# Patient Record
Sex: Female | Born: 1965
Health system: Southern US, Community
[De-identification: ages and names within clinical notes are randomized; demographics above are authoritative.]

## PROBLEM LIST (undated history)

## (undated) DIAGNOSIS — Z9189 Other specified personal risk factors, not elsewhere classified: Secondary | ICD-10-CM

## (undated) DIAGNOSIS — C4492 Squamous cell carcinoma of skin, unspecified: Secondary | ICD-10-CM

## (undated) DIAGNOSIS — K760 Fatty (change of) liver, not elsewhere classified: Secondary | ICD-10-CM

## (undated) DIAGNOSIS — G43909 Migraine, unspecified, not intractable, without status migrainosus: Secondary | ICD-10-CM

## (undated) DIAGNOSIS — R87619 Unspecified abnormal cytological findings in specimens from cervix uteri: Secondary | ICD-10-CM

## (undated) DIAGNOSIS — D219 Benign neoplasm of connective and other soft tissue, unspecified: Secondary | ICD-10-CM

## (undated) HISTORY — DX: Unspecified abnormal cytological findings in specimens from cervix uteri: R87.619

## (undated) HISTORY — DX: Fatty (change of) liver, not elsewhere classified: K76.0

## (undated) HISTORY — PX: PLANTAR FASCIA SURGERY: SHX746

## (undated) HISTORY — DX: Squamous cell carcinoma of skin, unspecified: C44.92

## (undated) HISTORY — DX: Other specified personal risk factors, not elsewhere classified: Z91.89

## (undated) HISTORY — DX: Migraine, unspecified, not intractable, without status migrainosus: G43.909

## (undated) HISTORY — DX: Benign neoplasm of connective and other soft tissue, unspecified: D21.9

---

## 1990-11-22 HISTORY — PX: AUGMENTATION MAMMAPLASTY: SUR837

## 2014-03-04 DIAGNOSIS — Z9189 Other specified personal risk factors, not elsewhere classified: Secondary | ICD-10-CM

## 2014-03-04 HISTORY — DX: Other specified personal risk factors, not elsewhere classified: Z91.89

## 2016-02-23 ENCOUNTER — Encounter: Payer: Self-pay | Admitting: Obstetrics and Gynecology

## 2016-02-23 ENCOUNTER — Ambulatory Visit (INDEPENDENT_AMBULATORY_CARE_PROVIDER_SITE_OTHER): Payer: 59 | Admitting: Obstetrics and Gynecology

## 2016-02-23 VITALS — BP 108/68 | HR 70 | Resp 16 | Ht 67.25 in | Wt 178.0 lb

## 2016-02-23 DIAGNOSIS — L853 Xerosis cutis: Secondary | ICD-10-CM | POA: Diagnosis not present

## 2016-02-23 DIAGNOSIS — N952 Postmenopausal atrophic vaginitis: Secondary | ICD-10-CM

## 2016-02-23 DIAGNOSIS — Z0000 Encounter for general adult medical examination without abnormal findings: Secondary | ICD-10-CM | POA: Diagnosis not present

## 2016-02-23 DIAGNOSIS — Z124 Encounter for screening for malignant neoplasm of cervix: Secondary | ICD-10-CM

## 2016-02-23 DIAGNOSIS — R635 Abnormal weight gain: Secondary | ICD-10-CM | POA: Diagnosis not present

## 2016-02-23 DIAGNOSIS — Z01419 Encounter for gynecological examination (general) (routine) without abnormal findings: Secondary | ICD-10-CM

## 2016-02-23 DIAGNOSIS — R61 Generalized hyperhidrosis: Secondary | ICD-10-CM

## 2016-02-23 LAB — POCT URINALYSIS DIPSTICK
Bilirubin, UA: NEGATIVE
Glucose, UA: NEGATIVE
Ketones, UA: NEGATIVE
Leukocytes, UA: NEGATIVE
NITRITE UA: NEGATIVE
PROTEIN UA: NEGATIVE
RBC UA: NEGATIVE
UROBILINOGEN UA: NEGATIVE
pH, UA: 5

## 2016-02-23 LAB — TSH: TSH: 3.04 m[IU]/L

## 2016-02-23 MED ORDER — GABAPENTIN 100 MG PO CAPS: ORAL_CAPSULE | ORAL | Status: DC

## 2016-02-23 MED ORDER — ESTRADIOL 0.1 MG/GM VA CREA: TOPICAL_CREAM | VAGINAL | Status: DC

## 2016-02-23 NOTE — Progress Notes (Signed)
50 y.o. [id number] MarriedCaucasianF here for annual exam.  She is PMP, no bleeding. She continues to have vasomotor symptoms. She has changed her diet and she is down to 5 hot flashes a day (down from 20). She isn't sleeping well, worse since menopause. 3 wakes up at least 3 x a night, +/- sweating. She has tried herbal supplements in the past no help. She was on a SSRI in the past for her migraines, helped her vasomotor symptoms.  She is sexually active, some vaginal dryness, uses a lubricant.  She has a 25% risk of breast cancer and is getting mammograms and intermittent MRI's (was q 6 months for a while). Due for a mammogram and MRI.  Mom had breast cancer at 59, still living, no BRCA testing. No other family history of breast cancer.  She can't loose weight, dry skin. Great energy.    Patient's last menstrual period was 12/24/2007.          Sexually active: Yes.    The current method of family planning is vasectomy.    Exercising: Yes.    walk & weights, walking 7 miles a day and weights a few days a week.  Smoker:  no  Health Maintenance: Pap:  2015 History of abnormal Pap:  Yes, ? Cone biopsy in her late 20's, normal since.  MMG: & MRI 2016 neg per patient Colonoscopy:  2015 normal BMD:   2014 TDaP:  ?, she will f/u with primary  Gardasil: no    reports that she has never smoked. She does not have any smokeless tobacco history on file. She reports that she drinks about 2.4 - 3.0 oz of alcohol per week. She reports that she does not use illicit drugs. Retired as a Human resources officer. Kids are 52 and 58 (both in school out of state)  Past Medical History  Diagnosis Date  . Abnormal Pap smear of cervix     unsure of procedure done. over 28yr ago  . Migraines   . Squamous cell skin cancer   . Fibroid     over 260yrago  Migraines have improved since menopause, mild auras  Past Surgical History  Procedure Laterality Date  . Plantar fascia surgery    . Augmentation mammaplasty    Mohs  surgery  Current Outpatient Prescriptions  Medication Sig Dispense Refill  . CHERRY PO Take by mouth daily. Pill form.    . Glucosamine HCl (GLUCOSAMINE PO) Take by mouth daily.    . Multiple Vitamins-Minerals (MULTIVITAMIN PO) Take by mouth daily.    . SUMAtriptan (IMITREX) 50 MG tablet Take 50 mg by mouth every 2 (two) hours as needed for migraine. Reported on 02/23/2016     No current facility-administered medications for this visit.    History reviewed. No pertinent family history.  Review of Systems : negative other than vasomotor symptoms. Some urgency to void, no leakage. Caffeine, 3 cups of coffee a day.   Exam:   BP 108/68 mmHg  Pulse 70  Resp 16  Ht 5' 7.25" (1.708 m)  Wt 178 lb (80.74 kg)  BMI 27.68 kg/m2  LMP 12/24/2007  Weight change: _0 @ Height:   Height: 5' 7.25" (170.8 cm)  Ht Readings from Last 3 Encounters:  02/23/16 5' 7.25" (1.708 m)    General appearance: alert, cooperative and appears stated age Head: Normocephalic, without obvious abnormality, atraumatic Neck: no adenopathy, supple, symmetrical, trachea midline and thyroid normal to inspection and palpation Lungs: clear to auscultation bilaterally Breasts: normal appearance,  no masses or tenderness Heart: regular rate and rhythm Abdomen: soft, non-tender; bowel sounds normal; no masses,  no organomegaly Extremities: extremities normal, atraumatic, no cyanosis or edema Skin: Skin color, texture, turgor normal. No rashes or lesions Lymph nodes: Cervical, supraclavicular, and axillary nodes normal. No abnormal inguinal nodes palpated Neurologic: Grossly normal   Pelvic: External genitalia:  no lesions              Urethra:  normal appearing urethra with no masses, tenderness or lesions              Bartholins and Skenes: normal                 Vagina: normal appearing vagina with normal color and discharge, no lesions              Cervix: no lesions               Bimanual Exam:  Uterus:   normal size, contour, position, consistency, mobility, non-tender              Adnexa: no mass, fullness, tenderness               Rectovaginal: Confirms               Anus:  normal sphincter tone, no lesions  Chaperone was present for exam.  A:  Well Woman with normal exam  Elevated risk of breast cancer  Vasomotor symptoms, worst at night  Vaginal atrophy  Can't loose weight, dry skin    P:   Breast imaging  Pap with hpv  Estrace cream   Gabapentin at bedtime for nightsweats/sleep  Discussed breast self exam  Discussed calcium and vit D intake  TSH, vit D

## 2016-02-24 LAB — VITAMIN D 25 HYDROXY (VIT D DEFICIENCY, FRACTURES): VIT D 25 HYDROXY: 24 ng/mL — AB (ref 30–100)

## 2016-02-25 ENCOUNTER — Encounter: Payer: Self-pay | Admitting: Emergency Medicine

## 2016-02-25 ENCOUNTER — Telehealth: Payer: Self-pay | Admitting: Emergency Medicine

## 2016-02-25 LAB — IPS PAP TEST WITH HPV

## 2016-02-25 NOTE — Telephone Encounter (Signed)
-----   Message from Salvadore Dom, MD sent at 02/25/2016 12:30 PM EDT ----- 02 Please inform of normal TSH, low vit D. She should be on a long term supplement of 1,000 IU of vit D daily.

## 2016-02-25 NOTE — Telephone Encounter (Signed)
Message left to return call to [place] at [phone].   Patient needs results from Dr. Talbert Nan.   02 recall placed.   Patient due for screening mammogram.  History of retropectoral saline implants.  Per MRI dated 03/04/2014 Bilateral Breast MRI completed at [place] in [facility], patient has a history of 20% or greater risk for breast cancer calculated using Tyrer-Cuzick model.

## 2016-02-25 NOTE — Telephone Encounter (Signed)
Patient returned call. She is given message from Dr. Talbert Nan regarding lab results. She verbalizes understanding of results and will start daily Vitamin D.   She is advised to call [facility] to schedule screening mammogram. Then can plan Breast MRI. Patient with history of claustrophobia, has required conscious [place]. Last MRI 03/04/2014. Last screening mammogram 03/06/2015.   Advised will need to plan MRI at hospital if needs conscious sedation. Patient with history of claustrophobia

## 2016-02-26 ENCOUNTER — Other Ambulatory Visit: Payer: Self-pay

## 2016-02-26 DIAGNOSIS — Z1231 Encounter for screening mammogram for malignant neoplasm of breast: Secondary | ICD-10-CM

## 2016-03-06 ENCOUNTER — Ambulatory Visit (INDEPENDENT_AMBULATORY_CARE_PROVIDER_SITE_OTHER): Payer: 59 | Admitting: Physician Assistant

## 2016-03-06 VITALS — BP 112/72 | HR 69 | Temp 97.7°F | Resp 18 | Ht 67.25 in | Wt 175.0 lb

## 2016-03-06 DIAGNOSIS — Z299 Encounter for prophylactic measures, unspecified: Secondary | ICD-10-CM

## 2016-03-06 DIAGNOSIS — H01006 Unspecified blepharitis left eye, unspecified eyelid: Secondary | ICD-10-CM

## 2016-03-06 DIAGNOSIS — Z418 Encounter for other procedures for purposes other than remedying health state: Secondary | ICD-10-CM

## 2016-03-06 MED ORDER — DOXYCYCLINE HYCLATE 100 MG PO CAPS: 100.0000 mg | ORAL_CAPSULE | Freq: Two times a day (BID) | ORAL | Status: AC

## 2016-03-06 MED ORDER — FLUCONAZOLE 150 MG PO TABS: 150.0000 mg | ORAL_TABLET | Freq: Once | ORAL | Status: DC

## 2016-03-06 NOTE — Patient Instructions (Signed)
     IF you received an x-ray today, you will receive an invoice from [facility]. Please contact [facility] at [phone] with questions or concerns regarding your invoice.   IF you received labwork today, you will receive an invoice from [facility]. Please contact [facility] at [phone] with questions or concerns regarding your invoice.   Our billing staff will not be able to assist you with questions regarding bills from these companies.  You will be contacted with the lab results as soon as they are available. The fastest way to get your results is to activate your My Chart account. Instructions are located on the last page of this paperwork. If you have not heard from us regarding the results in 2 weeks, please contact this office.      

## 2016-03-06 NOTE — Progress Notes (Signed)
   03/06/2016 11:01 AM   DOB: [birth year] / MRN: [id number]  SUBJECTIVE:  Tricia Barron is a 50 y.o. female presenting for left eye lid swelling.  She denies frank eye pain, changes in vision, photophobia, nausea, foreign body sensation,  and HA.  Has tried polytrim starting two days ago and has had no improvement. Has also been using warm compresses and this has not helped either.  She has had this problem before and her reports her eye swelled shut and she had to go to the ED and required antibiotics.  She does not complain of dry eyes and dry mouth.   She is allergic to phenergan.   She  has a past medical history of Abnormal Pap smear of cervix; Migraines; Squamous cell skin cancer; Fibroid; and At risk for breast cancer (03/04/2014).    She  reports that she has never smoked. She does not have any smokeless tobacco history on file. She reports that she drinks about 2.4 - 3.0 oz of alcohol per week. She reports that she does not use illicit drugs. She  reports that she currently engages in sexual activity and has had female partners. The patient  has past surgical history that includes Plantar fascia surgery and Augmentation mammaplasty.  Her family history includes Breast cancer (age of onset: 15) in her mother.  ROS  Negative unless otherwise states in HPI.   Problem list and medications reviewed and updated by myself where necessary, and exist elsewhere in the encounter.   OBJECTIVE:  BP 112/72 mmHg  Pulse 69  Temp(Src) 97.7 F (36.5 C) (Oral)  Resp 18  Ht 5' 7.25" (1.708 m)  Wt 175 lb (79.379 kg)  BMI 27.21 kg/m2  SpO2 98%  LMP 12/24/2007  Physical Exam  Constitutional: Vital signs are normal.  Eyes: Conjunctivae and EOM are normal. Pupils are equal, round, and reactive to light.    Cardiovascular: Normal rate and regular rhythm.   Pulmonary/Chest: Effort normal and breath sounds normal.     Visual Acuity Screening   Right eye Left eye Both eyes  Without correction:  20/15-1 20/20 20/15-1  With correction:        No results found for this or any previous visit (from the past 72 hour(s)).  No results found.  ASSESSMENT AND PLAN  Noris was seen today for eye infection.  Diagnoses and all orders for this visit:  Blepharitis, left: She appears to be headed into a direction of cellulitis of the upper lid. She has no frank eye symptoms or worrisome signs at this time.   Will start abx today.  Advised that if she worsens to call me and I will get her in with Dr. Katy Fitch.  Advised she go to the ED is she suddenly becomes worse over the holiday. She reports yeast infections with abx therapy.  Will send diflucan and advised she take only as needed.   -     doxycycline (VIBRAMYCIN) 100 MG capsule; Take 1 capsule (100 mg total) by mouth 2 (two) times daily.    The patient was advised to call or return to clinic if she does not see an improvement in symptoms or to seek the care of the closest emergency department if she worsens with the above plan.   Philis Fendt, MHS, PA-C [facility] 03/06/2016 11:01 AM

## 2016-03-08 ENCOUNTER — Telehealth: Payer: Self-pay

## 2016-03-08 DIAGNOSIS — H01006 Unspecified blepharitis left eye, unspecified eyelid: Secondary | ICD-10-CM

## 2016-03-08 NOTE — Telephone Encounter (Signed)
Referral in. Can we get her in as soon as possible.

## 2016-03-08 NOTE — Telephone Encounter (Signed)
Pt was seen over the weekend for an eye infection and patient states she is still having problems and dr Carlis Abbott stated to call back and he would refer her   Best number [phone]

## 2016-03-09 ENCOUNTER — Encounter: Payer: Self-pay | Admitting: Obstetrics and Gynecology

## 2016-03-16 ENCOUNTER — Ambulatory Visit
Admission: RE | Admit: 2016-03-16 | Discharge: 2016-03-16 | Disposition: A | Discharge status: Home / Self Care | Payer: 59 | Source: Ambulatory Visit

## 2016-03-16 ENCOUNTER — Other Ambulatory Visit: Payer: Self-pay

## 2016-03-16 DIAGNOSIS — Z1231 Encounter for screening mammogram for malignant neoplasm of breast: Secondary | ICD-10-CM

## 2016-03-18 ENCOUNTER — Telehealth: Payer: Self-pay

## 2016-03-18 DIAGNOSIS — Z803 Family history of malignant neoplasm of breast: Secondary | ICD-10-CM

## 2016-03-18 DIAGNOSIS — Z9189 Other specified personal risk factors, not elsewhere classified: Secondary | ICD-10-CM

## 2016-03-18 MED ORDER — LORAZEPAM 1 MG PO TABS: ORAL_TABLET | ORAL | Status: DC

## 2016-03-18 NOTE — Telephone Encounter (Signed)
-----   Message from Salvadore Dom, MD sent at 03/18/2016 11:06 AM EDT ----- Regarding: RE: breast MRI Yes please set her up. ----- Message -----    From: Jasmine Awe, RN    Sent: 03/17/2016   9:18 AM      To: Salvadore Dom, MD Subject: breast MRI                                     Dr.Jertson,  Patient was seen on 03/16/2016 at the [facility] for bilateral screening mammogram. Report is available in EPIC. Olivia Mackie asked I keep an eye out for these results as the patient needs a breast MRI. Is it okay to proceed with ordering and scheduling the MRI at this time? Per Olivia Mackie the patient will need to have an open MRI at [place] with Ativan as discussed with you previously. Please advise.  Thank you, Verline Lema

## 2016-03-18 NOTE — Telephone Encounter (Signed)
Spoke with patient. Advised rx for Ativan has been written and is ready for pick up at patient's earliest convenience. Patient states that she has a follow up appointment with Dr.Jertson on 03/23/2016 and would like to pick up prescription at that time.

## 2016-03-18 NOTE — Telephone Encounter (Signed)
Script for [employer]

## 2016-03-18 NOTE — Telephone Encounter (Signed)
Spoke with patient. Patient is agreeable to proceed with breast MRI at [place] in their open MRI machine. She would like rx for Ativan to help reduce anxiety during her appointment. She is aware she will need someone to drive her to and from her MRI appointment. Order for MRI breast bilateral w wo contrast placed. She is aware this will be precerted by our insurance and billing department. She will be contacted directly by [facility] to schedule. She is agreeable and verbalizes understanding.  Cc: Lerry Liner for [employer] to Dr.Jertson for review and rx order for Ativan.

## 2016-03-18 NOTE — Telephone Encounter (Signed)
Left message to call Mansur Patti at [phone]. 

## 2016-03-22 ENCOUNTER — Ambulatory Visit: Payer: 59 | Admitting: Obstetrics and Gynecology

## 2016-03-22 NOTE — Telephone Encounter (Signed)
Patient is scheduled for breast MRI on 03/25/2016 with [facility]. Will keep in imaging hold.  Routing to provider for final review. Patient agreeable to disposition. Will close encounter.

## 2016-03-23 ENCOUNTER — Encounter: Payer: Self-pay | Admitting: Obstetrics and Gynecology

## 2016-03-23 ENCOUNTER — Ambulatory Visit (INDEPENDENT_AMBULATORY_CARE_PROVIDER_SITE_OTHER): Payer: 59 | Admitting: Obstetrics and Gynecology

## 2016-03-23 VITALS — BP 112/62 | HR 68 | Resp 16 | Ht 67.0 in | Wt 180.0 lb

## 2016-03-23 DIAGNOSIS — Z780 Asymptomatic menopausal state: Secondary | ICD-10-CM

## 2016-03-23 DIAGNOSIS — R61 Generalized hyperhidrosis: Secondary | ICD-10-CM

## 2016-03-23 DIAGNOSIS — N951 Menopausal and female climacteric states: Secondary | ICD-10-CM | POA: Diagnosis not present

## 2016-03-23 DIAGNOSIS — R232 Flushing: Secondary | ICD-10-CM

## 2016-03-23 NOTE — Progress Notes (Signed)
GYNECOLOGY  VISIT   HPI: 50 y.o.   Married  Caucasian  female   G2P2002 with Patient's last menstrual period was 12/24/2007.   here for   F/u after starting Gabapentin. The patient was started on gabapentin last month for night sweats. The gabapentin was given her bad dreams, she was on it for 1.5 weeks. She was on lexapro in the past, helped her vasomotor symptoms, but didn't like how she felt on it. She is up 3-4 x a night with sweats, overall tolerable. She is very sensitive to medicine.  She has her breast MRI scheduled for later this week, has ativan to take for it (script given)  GYNECOLOGIC HISTORY: Patient's last menstrual period was 12/24/2007. Contraception:Post Menopausal Menopausal hormone therapy: none, vaginal estrogen only        OB History    Gravida Para Term Preterm AB TAB SAB Ectopic Multiple Living   2 2 2       2          There are no active problems to display for this patient.   Past Medical History  Diagnosis Date  . Abnormal Pap smear of cervix     unsure of procedure done. over 68yrs ago  . Migraines   . Squamous cell skin cancer   . Fibroid     over 71yrs ago  . At risk for breast cancer 03/04/2014    High-20% or greater/Tyrer-Cuzick Model     Past Surgical History  Procedure Laterality Date  . Plantar fascia surgery    . Augmentation mammaplasty      Current Outpatient Prescriptions  Medication Sig Dispense Refill  . estradiol (ESTRACE) 0.1 MG/GM vaginal cream 1 gram vaginally qhs x 1 week, then twice weekly at bedtime 42.5 g 1  . Glucosamine HCl (GLUCOSAMINE PO) Take by mouth daily.    Marland Kitchen LORazepam (ATIVAN) 1 MG tablet 1 tab po 1 hour prior to the MRI 1 tablet 0  . Multiple Vitamins-Minerals (MULTIVITAMIN PO) Take by mouth daily.    . SUMAtriptan (IMITREX) 50 MG tablet Take 50 mg by mouth every 2 (two) hours as needed for migraine. Reported on 02/23/2016    . VITAMIN D, CHOLECALCIFEROL, PO Take 600 mg by mouth daily.    Marland Kitchen gabapentin (NEURONTIN)  100 MG capsule 1 cap qhs x 1 week, if doing well can then increase to 2 caps qhs, in one more week can increase to 3 caps po qhs (Patient not taking: Reported on 03/06/2016) 90 capsule 1   No current facility-administered medications for this visit.     ALLERGIES: Phenergan  Family History  Problem Relation Age of Onset  . Breast cancer Mother 82    stage 2    Social History   Social History  . Marital Status: Married    Spouse Name: N/A  . Number of Children: N/A  . Years of Education: N/A   Occupational History  . Not on file.   Social History Main Topics  . Smoking status: Never Smoker   . Smokeless tobacco: Not on file  . Alcohol Use: 2.4 - 3.0 oz/week    4-5 Standard drinks or equivalent per week  . Drug Use: No  . Sexual Activity:    Partners: Male     Comment: husband vasectomy   Other Topics Concern  . Not on file   Social History Narrative    Review of Systems  Constitutional: Negative.   HENT: Negative.   Eyes:  Stye on left eye  Respiratory: Negative.   Cardiovascular: Negative.   Genitourinary: Negative.   Musculoskeletal: Negative.   Skin:       New or changed mole/lump  Neurological: Negative.   Endo/Heme/Allergies: Negative.   Psychiatric/Behavioral: Negative.     PHYSICAL EXAMINATION:    BP 112/62 mmHg  Pulse 68  Resp 16  Ht 5\' 7"  (1.702 m)  Wt 180 lb (81.647 kg)  BMI 28.19 kg/m2  LMP 12/24/2007    General appearance: alert, cooperative and appears stated age  ASSESSMENT Menopausal vasomotor symptoms, hasn't tolerated lexapro or gabapentin, I wouldn't recommend systemic estrogen given her elevated risk of breast cancer    PLAN Discussed herbal products, behavioral changes Offered a different SSRI, she declines She states she can tolerate her symptoms   An After Visit Summary was printed and given to the patient.  10-15 minutes face to face time of which over 50% was spent in counseling.

## 2016-03-25 ENCOUNTER — Ambulatory Visit
Admission: RE | Admit: 2016-03-25 | Discharge: 2016-03-25 | Disposition: A | Discharge status: Home / Self Care | Payer: 59 | Source: Ambulatory Visit | Attending: Obstetrics and Gynecology | Admitting: Obstetrics and Gynecology

## 2016-03-25 ENCOUNTER — Other Ambulatory Visit: Payer: Self-pay | Admitting: Obstetrics and Gynecology

## 2016-03-25 DIAGNOSIS — Z803 Family history of malignant neoplasm of breast: Secondary | ICD-10-CM

## 2016-03-25 DIAGNOSIS — Z9189 Other specified personal risk factors, not elsewhere classified: Secondary | ICD-10-CM

## 2016-03-25 MED ORDER — GADOBENATE DIMEGLUMINE 529 MG/ML IV SOLN
16.0000 mL | Freq: Once | INTRAVENOUS | Status: AC | PRN
  Administered 2016-03-25: 16 mL via INTRAVENOUS

## 2016-04-26 ENCOUNTER — Encounter: Payer: Self-pay | Admitting: Obstetrics and Gynecology

## 2016-04-27 ENCOUNTER — Telehealth: Payer: Self-pay | Admitting: Obstetrics and Gynecology

## 2016-04-27 NOTE — Telephone Encounter (Signed)
Return call to patient and she is advised of results per Dr. Talbert Nan and they were sent via mychart. Patient will check her mychart account.   Patient states that MRI was very uncomfortable and that she does not want to do open MRI again with oral anxiolytics. She states that the medication did not work for her very long.  She will research her options for next MRI, but feels she may need sedation again.   Routing to Dr. Talbert Nan and will close.

## 2016-04-27 NOTE — Telephone Encounter (Signed)
Mychart message from Dr. Talbert Nan:    Entered by Salvadore Dom, MD at 03/31/2016 10:17 AM   Hi Tricia Barron,  Your breast MRI was normal! You need a follow up mammogram next year and a follow up MRI in 1-2 years. Please call the office with any questions.  [name]

## 2016-04-27 NOTE — Telephone Encounter (Signed)
Patient is calling for her MRI results. Patient said she had this MRI over a month ago and no one has called her with any results.

## 2016-11-24 DIAGNOSIS — L570 Actinic keratosis: Secondary | ICD-10-CM | POA: Diagnosis not present

## 2017-01-20 ENCOUNTER — Encounter: Payer: Self-pay | Admitting: Podiatry

## 2017-01-20 ENCOUNTER — Ambulatory Visit (INDEPENDENT_AMBULATORY_CARE_PROVIDER_SITE_OTHER): Payer: 59

## 2017-01-20 ENCOUNTER — Ambulatory Visit (INDEPENDENT_AMBULATORY_CARE_PROVIDER_SITE_OTHER): Payer: 59 | Admitting: Podiatry

## 2017-01-20 DIAGNOSIS — M722 Plantar fascial fibromatosis: Secondary | ICD-10-CM | POA: Diagnosis not present

## 2017-01-20 NOTE — Progress Notes (Signed)
   Subjective:    Patient ID: Tricia Barron, female    DOB: 06-29-1966, 51 y.o.   MRN: [id number]  HPI: She presents today with chief complaint of right heel pain. States that she is bad surgery for plantar fasciitis on her left foot several years ago but recently has developed plan a fasciitis in her right foot as of December 2017. Warnings are particularly bad and she states that she has radiating pain of the back of her leg now. No trauma.    Review of Systems  All other systems reviewed and are negative.      Objective:   Physical Exam: Vital signs are stable alert and oriented 3. Pulses are palpable. Neurologic sensorium is intact. Deep tendon reflexes are intact muscle strength is normal. Orthopedic evaluation of his wrist pain on palpation medial calcaneal tubercle of the right heel. Radiographs taken today demonstrate plantar distally oriented calcaneal heel spur with soft tissue increase in density at the plantar fascia calcaneal insertion site. No fractures are noted. Cutaneous evaluation Mr. supple well-hydrated cutis no erythema edema cellulitis drainage or odor.        Assessment & Plan:  Assessment: Plantar fasciitis.  Plan: Injected the right heel today with Kenalog and local anesthetic at her plantar fascia brace and a night splint. Discussed appropriate shoe gear stretching exercises ice therapy shoe modification started her on a Medrol Dosepak to be followed by meloxicam. Follow up with her in 1 month

## 2017-01-20 NOTE — Patient Instructions (Signed)

## 2017-01-21 ENCOUNTER — Telehealth: Payer: Self-pay

## 2017-01-21 NOTE — Telephone Encounter (Signed)
Pt called stating that her Rx was not sent to the pharmacy

## 2017-01-22 ENCOUNTER — Other Ambulatory Visit: Payer: Self-pay | Admitting: Podiatry

## 2017-01-22 MED ORDER — METHYLPREDNISOLONE 4 MG PO TBPK: ORAL_TABLET | ORAL | 0 refills | Status: DC

## 2017-01-22 MED ORDER — MELOXICAM 15 MG PO TABS: 15.0000 mg | ORAL_TABLET | Freq: Every day | ORAL | 3 refills | Status: DC

## 2017-01-22 NOTE — Telephone Encounter (Signed)
I sent it on Saturday.  You can check to see if she went to pick it up

## 2017-02-17 ENCOUNTER — Ambulatory Visit (INDEPENDENT_AMBULATORY_CARE_PROVIDER_SITE_OTHER): Payer: 59 | Admitting: Podiatry

## 2017-02-17 ENCOUNTER — Encounter: Payer: Self-pay | Admitting: Podiatry

## 2017-02-17 DIAGNOSIS — M722 Plantar fascial fibromatosis: Secondary | ICD-10-CM | POA: Diagnosis not present

## 2017-02-17 NOTE — Progress Notes (Signed)
She presents today for follow-up of her right heel states is doing for the past 2 weeks and then started to hurt again.  Objective: Vital signs are stable alert and oriented 3. Pulses are palpable. She's been on palpation medial calcaneal tubercle of the right heel.  Assessment: Plantar fasciitis right.  Plan: She was scanned for set of orthotics today and reinjected with her heel. Follow-up with her once her orthotics come in.

## 2017-02-18 ENCOUNTER — Other Ambulatory Visit: Payer: Self-pay | Admitting: Obstetrics and Gynecology

## 2017-02-18 DIAGNOSIS — Z1231 Encounter for screening mammogram for malignant neoplasm of breast: Secondary | ICD-10-CM

## 2017-02-28 ENCOUNTER — Encounter: Payer: Self-pay | Admitting: Obstetrics and Gynecology

## 2017-02-28 ENCOUNTER — Ambulatory Visit (INDEPENDENT_AMBULATORY_CARE_PROVIDER_SITE_OTHER): Payer: 59 | Admitting: Obstetrics and Gynecology

## 2017-02-28 VITALS — BP 100/60 | HR 60 | Resp 14 | Ht 67.0 in | Wt 190.0 lb

## 2017-02-28 DIAGNOSIS — E559 Vitamin D deficiency, unspecified: Secondary | ICD-10-CM | POA: Diagnosis not present

## 2017-02-28 DIAGNOSIS — Z23 Encounter for immunization: Secondary | ICD-10-CM | POA: Diagnosis not present

## 2017-02-28 DIAGNOSIS — R635 Abnormal weight gain: Secondary | ICD-10-CM

## 2017-02-28 DIAGNOSIS — Z0000 Encounter for general adult medical examination without abnormal findings: Secondary | ICD-10-CM | POA: Diagnosis not present

## 2017-02-28 DIAGNOSIS — Z1159 Encounter for screening for other viral diseases: Secondary | ICD-10-CM

## 2017-02-28 DIAGNOSIS — Z01419 Encounter for gynecological examination (general) (routine) without abnormal findings: Secondary | ICD-10-CM

## 2017-02-28 DIAGNOSIS — L853 Xerosis cutis: Secondary | ICD-10-CM | POA: Diagnosis not present

## 2017-02-28 LAB — COMPREHENSIVE METABOLIC PANEL
ALK PHOS: 56 U/L (ref 33–130)
ALT: 46 U/L — AB (ref 6–29)
AST: 34 U/L (ref 10–35)
Albumin: 4.5 g/dL (ref 3.6–5.1)
BILIRUBIN TOTAL: 0.5 mg/dL (ref 0.2–1.2)
BUN: 16 mg/dL (ref 7–25)
CO2: 31 mmol/L (ref 20–31)
Calcium: 10 mg/dL (ref 8.6–10.4)
Chloride: 103 mmol/L (ref 98–110)
Creat: 0.8 mg/dL (ref 0.50–1.05)
Glucose, Bld: 90 mg/dL (ref 65–99)
Potassium: 4.4 mmol/L (ref 3.5–5.3)
Sodium: 139 mmol/L (ref 135–146)
Total Protein: 7.2 g/dL (ref 6.1–8.1)

## 2017-02-28 LAB — CBC
HCT: 45 % (ref 35.0–45.0)
Hemoglobin: 15.2 g/dL (ref 11.7–15.5)
MCH: 31.7 pg (ref 27.0–33.0)
MCHC: 33.8 g/dL (ref 32.0–36.0)
MCV: 93.8 fL (ref 80.0–100.0)
MPV: 10.4 fL (ref 7.5–12.5)
PLATELETS: 272 10*3/uL (ref 140–400)
RBC: 4.8 MIL/uL (ref 3.80–5.10)
RDW: 13.5 % (ref 11.0–15.0)
WBC: 6.1 10*3/uL (ref 3.8–10.8)

## 2017-02-28 LAB — LIPID PANEL
CHOL/HDL RATIO: 2 ratio (ref <5.0)
Cholesterol: 126 mg/dL (ref <200)
HDL: 62 mg/dL (ref >50)
LDL Cholesterol: 50 mg/dL (ref <100)
Triglycerides: 68 mg/dL (ref <150)
VLDL: 14 mg/dL (ref <30)

## 2017-02-28 LAB — HEPATITIS C ANTIBODY: HCV AB: NEGATIVE

## 2017-02-28 LAB — TSH: TSH: 1.72 m[IU]/L

## 2017-02-28 MED ORDER — SUMATRIPTAN SUCCINATE 50 MG PO TABS: ORAL_TABLET | ORAL | 2 refills | Status: DC

## 2017-02-28 NOTE — Patient Instructions (Signed)

## 2017-02-28 NOTE — Progress Notes (Addendum)
51 y.o. [id number] MarriedCaucasianF here for annual exam.  No vaginal bleeding. Sexually active, no pain, some dryness.  She has an elevated risk of breast cancer. She had a negative breast MRI on 03/25/16, mammogram scheduled later this month. She didn't do well with the MRI, in the past she had sedation with it. She gets very anxious, finds it uncomfortable. She had a much better experience in [place].  She has vasomotor symptoms, mostly at night. Tolerable. Didn't like the gabapentin.  She has had some weight gain and dry skin.  She is having trouble sleeping, falls asleep, wakes up. Worsened with menopause. She went to a sleep clinic in the distant past. She takes Valarean and melatonin. Occasionally snores, wakes her up. She has a bed that adjusts.  Rare migraines, needs some imitrex.     Patient's last menstrual period was 12/24/2007.          Sexually active: Yes.    The current method of family planning is vasectomy.    Exercising: Yes.    weights/ eliptical  Smoker:  no  Health Maintenance: Pap:  02-23-16 WNL NEG HR HPV/ 12-09-11 WNL NEG HR HPV History of abnormal Pap:  Yes years ago, ? [facility] MMG:  03-16-16 WNL  Colonoscopy:  2016 WNL per patient  BMD:   Never TDaP:  unsure Gardasil: N/A   reports that she has never smoked. She has never used smokeless tobacco. She reports that she drinks about 1.8 oz of alcohol per week . She reports that she does not use drugs. Retired as a Human resources officer. Kids are 22 and 19. Oldest is working, youngest wants to go to med school.   Past Medical History:  Diagnosis Date  . Abnormal Pap smear of cervix    unsure of procedure done. over 16yrs ago  . At risk for breast cancer 03/04/2014   High-20% or greater/Tyrer-Cuzick Model   . Fibroid    over 83yrs ago  . Migraines   . Squamous cell skin cancer     Past Surgical History:  Procedure Laterality Date  . AUGMENTATION MAMMAPLASTY    . PLANTAR FASCIA SURGERY      Current Outpatient  Prescriptions  Medication Sig Dispense Refill  . Glucosamine HCl (GLUCOSAMINE PO) Take by mouth daily.    . meloxicam (MOBIC) 15 MG tablet Take 1 tablet (15 mg total) by mouth daily. 30 tablet 3  . Multiple Vitamins-Minerals (MULTIVITAMIN PO) Take by mouth daily.    . SUMAtriptan (IMITREX) 50 MG tablet Take 50 mg by mouth every 2 (two) hours as needed for migraine. Reported on 02/23/2016     No current facility-administered medications for this visit.     Family History  Problem Relation Age of Onset  . Breast cancer Mother 70    stage 2    Review of Systems  Constitutional: Negative.   HENT: Negative.   Eyes: Negative.   Respiratory: Negative.   Cardiovascular: Negative.   Gastrointestinal: Negative.   Endocrine: Negative.   Genitourinary: Negative.   Musculoskeletal: Negative.   Skin: Negative.   Allergic/Immunologic: Negative.   Neurological: Negative.   Psychiatric/Behavioral: Negative.     Exam:   BP 100/60 (BP Location: Right Arm, Patient Position: Sitting, Cuff Size: Normal)   Pulse 60   Resp 14   Ht 5\' 7"  (1.702 m)   Wt 190 lb (86.2 kg)   LMP 12/24/2007   BMI 29.76 kg/m   Weight change: @WEIGHTCHANGE @ Height:   Height: 5\' 7"  (170.2  cm)  Ht Readings from Last 3 Encounters:  02/28/17 5\' 7"  (1.702 m)  03/23/16 5\' 7"  (1.702 m)  03/06/16 5' 7.25" (1.708 m)    General appearance: alert, cooperative and appears stated age Head: Normocephalic, without obvious abnormality, atraumatic Neck: no adenopathy, supple, symmetrical, trachea midline and thyroid normal to inspection and palpation Lungs: clear to auscultation bilaterally Cardiovascular: regular rate and rhythm Breasts: normal appearance, no masses or tenderness, bilateral implants Abdomen: soft, non-tender; bowel sounds normal; no masses,  no organomegaly Extremities: extremities normal, atraumatic, no cyanosis or edema Skin: Skin color, texture, turgor normal. No rashes or lesions Lymph nodes: Cervical,  supraclavicular, and axillary nodes normal. No abnormal inguinal nodes palpated Neurologic: Grossly normal   Pelvic: External genitalia:  no lesions              Urethra:  normal appearing urethra with no masses, tenderness or lesions              Bartholins and Skenes: normal                 Vagina: normal appearing vagina with normal color and discharge, no lesions              Cervix: no lesions               Bimanual Exam:  Uterus:  normal size, contour, position, consistency, mobility, non-tender              Adnexa: no mass, fullness, tenderness               Rectovaginal: Confirms               Anus:  normal sphincter tone, no lesions  Chaperone was present for exam.  A:  Well Woman with normal exam  Vasomotor symptoms, tolerable  Sleep issues, will f/u with her primary  Migraines, imitrex for prn use  Increased risk breast cancer, mammogram later this month  P:   Screening labs    Vit D   TSH  Hepatitis C (screening)  No pap this year  Mammogram  Will look into options for sedation for MRI  Discussed breast self exam  Discussed calcium and vit D intake  TDAP  Aware of recommendation for yearly MRI, she had a bad experience last year. Will try and find a location that she can have sedation with the breast MRI.   04/10/17 Addendum: [place]04[place]17, normal

## 2017-03-01 ENCOUNTER — Telehealth: Payer: Self-pay | Admitting: *Deleted

## 2017-03-01 LAB — VITAMIN D 25 HYDROXY (VIT D DEFICIENCY, FRACTURES): VIT D 25 HYDROXY: 25 ng/mL — AB (ref 30–100)

## 2017-03-01 NOTE — Telephone Encounter (Signed)
-----   Message from Salvadore Dom, MD sent at 03/01/2017  1:13 PM EDT ----- Please let the patient know that her vit d level is still low. She should start taking 1,000 IU of vit d 3 daily.  Her ALT (liver function) is mildly elevated. She should avoid ETOH, eat a healthy diet, exercise regularly and have her liver panel rechecked in 2 months. Please place the order for a liver panel. The rest of her blood work was normal.

## 2017-03-01 NOTE — Telephone Encounter (Signed)
Left message for patient to call regarding labs -eh 

## 2017-03-01 NOTE — Telephone Encounter (Signed)
Patient returned your call.

## 2017-03-01 NOTE — Telephone Encounter (Signed)
Spoke with patient and gave results. Scheduled for 2 month recheck of liver function. Pt voiced understanding -eh

## 2017-03-02 ENCOUNTER — Telehealth: Payer: Self-pay

## 2017-03-02 DIAGNOSIS — Z803 Family history of malignant neoplasm of breast: Secondary | ICD-10-CM

## 2017-03-02 DIAGNOSIS — Z9189 Other specified personal risk factors, not elsewhere classified: Secondary | ICD-10-CM

## 2017-03-02 NOTE — Telephone Encounter (Signed)
This will have to be done at the hospital as [facility] does not perform MRI's with sedation. When placing order two options are oral sedation and general anesthesia. Please advise which is recommended for this patient.

## 2017-03-02 NOTE — Telephone Encounter (Signed)
She would like IV sedation, like with colonoscopies. She doesn't need to be intubated.

## 2017-03-02 NOTE — Telephone Encounter (Signed)
-----   Message from Salvadore Dom, MD sent at 02/28/2017  2:08 PM EDT ----- Sandrea Hughs, This is a patient that we tried to find a place to do her breast mri with sedation last year. We ended up giving her ativan and she had a bad experience.  Can you please see if you can figure out how to do the breast MRI with sedation? Even if it's in the hospital. Thanks, Sharee Pimple

## 2017-03-02 NOTE — Telephone Encounter (Signed)
Spoke with Tricia Barron at [facility]. Appointment for bilateral breast MRI w wo contrast with IV sedation scheduled for 04/05/2017 at 8 am at [facility], [place], [place] [address]. Patient will need to bring a driver to this appointment. [name] will call the patient to go over any instructions about MRI and about arrival on the day of imaging.  Left message to call [place] at [phone].

## 2017-03-02 NOTE — Telephone Encounter (Signed)
Spoke with Elmo Putt at [place]. Elmo Putt will need to speak with the radiologist regarding imaging and return call. Patient has a mammogram scheduled for 03/18/2017 and this will need to be scheduled after.

## 2017-03-03 NOTE — Telephone Encounter (Signed)
Spoke with patient. Advised of appointment date and time as seen below for bilateral breast MRI w wo contrast with IV sedation. Patient is agreeable and verbalizes understanding. States that she may need to reschedule this appointment and will speak with [name] about scheduling when they contact her regarding appointment and benefits. Placed in imaging hold. Patient states she was notified by our office that her ALT was elevated. Asking if taking meloxicam 15 mg daily could be causing this. Has been taking this medication for a month and a half. Advised NSAID medications can alter live functions studies. Patient is asking if she should consider discontinuing this medication. Advised will review with Dr.Jertson and return call. Patient is agreeable.

## 2017-03-07 NOTE — Telephone Encounter (Signed)
Patient is returning a call to Kaitlyn. °

## 2017-03-07 NOTE — Telephone Encounter (Signed)
The mobic is associated with a less than 2% risk of elevated LFT's. I don't know if stopping the mobic would help. No adjustment in dosage of mobic is recommended with low level elevations of LFT's. I think she is on the mobic for plantar fascitis. If she feels she can get by without the mobic, then I would stop it. She can also discuss other options for treatment of her fascitis with the provider who she sees for that.

## 2017-03-07 NOTE — Telephone Encounter (Signed)
Spoke with patient. Advised of message as seen below from [place]. Patient is agreeable and verbalizes understanding. Patient would like to continue taking Meloxicam at this time as she is on vacation and does not want to risk a flare. May stop taking Meloxicam when she returns. Will think about this and return call with any questions.  Routing to provider for final review. Patient agreeable to disposition. Will close encounter.

## 2017-03-07 NOTE — Telephone Encounter (Signed)
Left message to call Tricia Barron at [phone]. 

## 2017-03-08 ENCOUNTER — Other Ambulatory Visit: Payer: 59

## 2017-03-16 ENCOUNTER — Other Ambulatory Visit: Payer: 59

## 2017-03-17 ENCOUNTER — Telehealth: Payer: Self-pay | Admitting: Obstetrics and Gynecology

## 2017-03-17 NOTE — Telephone Encounter (Signed)
Spoke with patient. Advised order has been faxed with confirmation to [facility] and that appointment with [facility] has been cancelled. Patient is agreeable and verbalizes understanding. Remains in imaging hold.  Routing to provider for final review. Patient agreeable to disposition. Will close encounter.

## 2017-03-17 NOTE — Telephone Encounter (Signed)
Spoke with patient to verify information provided. Patient would like to cancel appointment for MRI with [facility] as she is scheduled in [place] for imaging. Advised will cancel her imaging with [facility] and write a order for this to be performed in [place].   Call to [facility] appointment for bilateral breast MRI w wo contast with IV sedation cancelled. Order for bilateral breast MRI w wo contrast with IV sedation to Dr.Jertson for review and signature to send to [employer] in [place].

## 2017-03-17 NOTE — Telephone Encounter (Signed)
Patient called requesting to cancel her MRI with [facility]. They would not let her cancel this. Patient stated Margaretha Sheffield and Dr. Talbert Nan are aware she may request this. The patient has rescheduled to a [place] facility where she is already scheduled.  [email or web address] MRI with IV sedation Fax: [phone] Phone: [phone]  Appt 5/14//18 at 2:00 PM in [place], [place] order for "sedation and MRI" sent to the facility.

## 2017-03-18 ENCOUNTER — Ambulatory Visit
Admission: RE | Admit: 2017-03-18 | Discharge: 2017-03-18 | Disposition: A | Discharge status: Home / Self Care | Payer: 59 | Source: Ambulatory Visit | Attending: Obstetrics and Gynecology | Admitting: Obstetrics and Gynecology

## 2017-03-18 DIAGNOSIS — Z1231 Encounter for screening mammogram for malignant neoplasm of breast: Secondary | ICD-10-CM

## 2017-03-30 ENCOUNTER — Telehealth: Payer: Self-pay | Admitting: Obstetrics and Gynecology

## 2017-03-30 NOTE — Telephone Encounter (Signed)
Patient called and said she is scheduled outside of [place] for her MRI. She needs [place] to call and cancel it for her on 04/05/17 at [facility]. The patient said she called and requested this before but it was not cancelled  [facility] just called to confirm the appointment and told her it is still scheduled.

## 2017-03-30 NOTE — Telephone Encounter (Signed)
Call to [facility] Radiology department. Per radiology the appointment has been cancelled and the patient is no longer on the schedule. Call to the patient. Advised I have verified that the appointment has been cancelled. Patient verbalizes understanding.  Routing to provider for final review. Patient agreeable to disposition. Will close encounter.

## 2017-04-04 ENCOUNTER — Telehealth: Payer: Self-pay | Admitting: Obstetrics and Gynecology

## 2017-04-04 ENCOUNTER — Encounter (HOSPITAL_COMMUNITY): Payer: Self-pay | Admitting: Anesthesiology

## 2017-04-04 DIAGNOSIS — Z803 Family history of malignant neoplasm of breast: Secondary | ICD-10-CM | POA: Diagnosis not present

## 2017-04-04 DIAGNOSIS — Z1239 Encounter for other screening for malignant neoplasm of breast: Secondary | ICD-10-CM | POA: Diagnosis not present

## 2017-04-04 NOTE — Telephone Encounter (Signed)
Patient returning your call.

## 2017-04-04 NOTE — Telephone Encounter (Signed)
Spoke with Jackelyn Poling who states she will contact [facility] to discuss the patient's bilateral breast MRI. Received telephone call from Maudie Mercury with [name] who states that she still sees the MRI on the schedule. Verified again that this will need to be cancelled as the patient is having this done out of town today. Maudie Mercury has cancelled the patient's MRI.  Left message for patient to call [place] at [phone].

## 2017-04-04 NOTE — Telephone Encounter (Signed)
Spoke with patient. Advised patient I have spoken with Maudie Mercury at [facility] and her appointment for breast MRI has been cancelled and this has been verified. Patient is agreeable.  Routing to provider for final review. Patient agreeable to disposition. Will close encounter.

## 2017-04-04 NOTE — Telephone Encounter (Signed)
Call to [facility] with preadmission testing at [phone]. Left a message to return call regarding patient's breast MRI. This was cancelled on 03/17/2017 and verified on 03/30/2017. Need to again verify this has been cancelled for the patient.

## 2017-04-04 NOTE — Telephone Encounter (Signed)
Patient says she tried cancelling her mri appointment for tomorrow morning over at the hospital but was told our office needed to cancel appointment. She received a call from [place] at [phone].

## 2017-04-05 ENCOUNTER — Ambulatory Visit (HOSPITAL_COMMUNITY): Admission: RE | Admit: 2017-04-05 | Payer: 59 | Source: Ambulatory Visit

## 2017-04-05 ENCOUNTER — Ambulatory Visit (HOSPITAL_COMMUNITY): Payer: 59

## 2017-04-05 ENCOUNTER — Encounter (HOSPITAL_COMMUNITY): Payer: Self-pay

## 2017-04-05 ENCOUNTER — Encounter (HOSPITAL_COMMUNITY): Admission: RE | Payer: Self-pay | Source: Ambulatory Visit

## 2017-04-05 SURGERY — RADIOLOGY WITH ANESTHESIA
Anesthesia: General | Laterality: Bilateral

## 2017-04-13 ENCOUNTER — Telehealth: Payer: Self-pay | Admitting: *Deleted

## 2017-04-13 DIAGNOSIS — Z808 Family history of malignant neoplasm of other organs or systems: Secondary | ICD-10-CM | POA: Diagnosis not present

## 2017-04-13 DIAGNOSIS — L821 Other seborrheic keratosis: Secondary | ICD-10-CM | POA: Diagnosis not present

## 2017-04-13 DIAGNOSIS — Z85828 Personal history of other malignant neoplasm of skin: Secondary | ICD-10-CM | POA: Diagnosis not present

## 2017-04-13 DIAGNOSIS — L570 Actinic keratosis: Secondary | ICD-10-CM | POA: Diagnosis not present

## 2017-04-13 NOTE — Telephone Encounter (Signed)
Pt states she would like another small/medium plantar fascial brace. I informed pt that she could pick up at the front reception and we would bill insurance initially, and bill pt for remainder. I offered pt an appt, pt states she and Dr. Milinda Pointer have a plan.

## 2017-04-21 ENCOUNTER — Encounter: Payer: Self-pay | Admitting: Obstetrics and Gynecology

## 2017-04-21 ENCOUNTER — Other Ambulatory Visit: Payer: Self-pay

## 2017-04-21 DIAGNOSIS — Z9189 Other specified personal risk factors, not elsewhere classified: Secondary | ICD-10-CM

## 2017-04-21 DIAGNOSIS — Z803 Family history of malignant neoplasm of breast: Secondary | ICD-10-CM

## 2017-05-02 ENCOUNTER — Other Ambulatory Visit (INDEPENDENT_AMBULATORY_CARE_PROVIDER_SITE_OTHER): Payer: 59

## 2017-05-02 DIAGNOSIS — R748 Abnormal levels of other serum enzymes: Secondary | ICD-10-CM | POA: Diagnosis not present

## 2017-05-02 DIAGNOSIS — Z0000 Encounter for general adult medical examination without abnormal findings: Secondary | ICD-10-CM

## 2017-05-03 LAB — HEPATIC FUNCTION PANEL
ALBUMIN: 4.3 g/dL (ref 3.5–5.5)
ALK PHOS: 51 IU/L (ref 39–117)
ALT: 55 IU/L — ABNORMAL HIGH (ref 0–32)
AST: 42 IU/L — ABNORMAL HIGH (ref 0–40)
BILIRUBIN TOTAL: 0.5 mg/dL (ref 0.0–1.2)
BILIRUBIN, DIRECT: 0.15 mg/dL (ref 0.00–0.40)
TOTAL PROTEIN: 6.7 g/dL (ref 6.0–8.5)

## 2017-05-10 ENCOUNTER — Encounter: Payer: Self-pay | Admitting: *Deleted

## 2017-05-12 ENCOUNTER — Encounter: Payer: Self-pay | Admitting: Podiatry

## 2017-05-12 ENCOUNTER — Ambulatory Visit (INDEPENDENT_AMBULATORY_CARE_PROVIDER_SITE_OTHER): Payer: 59 | Admitting: Podiatry

## 2017-05-12 ENCOUNTER — Ambulatory Visit: Payer: 59 | Admitting: Family Medicine

## 2017-05-12 VITALS — BP 123/75 | HR 80 | Resp 16

## 2017-05-12 DIAGNOSIS — M722 Plantar fascial fibromatosis: Secondary | ICD-10-CM

## 2017-05-12 MED ORDER — DICLOFENAC SODIUM 1 % TD GEL: 4.0000 g | Freq: Four times a day (QID) | TRANSDERMAL | 3 refills | Status: DC

## 2017-05-12 NOTE — Patient Instructions (Signed)
Pre-Operative Instructions  Congratulations, you have decided to take an important step to improving your quality of life.  You can be assured that the doctors of [facility] will be with you every step of the way.  1. Plan to be at the surgery center/hospital at least 1 (one) hour prior to your scheduled time unless otherwise directed by the surgical center/hospital staff.  You must have a responsible adult accompany you, remain during the surgery and drive you home.  Make sure you have directions to the surgical center/hospital and know how to get there on time. 2. For hospital based surgery you will need to obtain a history and physical form from your family physician within 1 month prior to the date of surgery- we will give you a form for you primary physician.  3. We make every effort to accommodate the date you request for surgery.  There are however, times where surgery dates or times have to be moved.  We will contact you as soon as possible if a change in schedule is required.   4. No Aspirin/Ibuprofen for one week before surgery.  If you are on aspirin, any non-steroidal anti-inflammatory medications (Mobic, Aleve, Ibuprofen) you should stop taking it 7 days prior to your surgery.  You make take Tylenol  For pain prior to surgery.  5. Medications- If you are taking daily heart and blood pressure medications, seizure, reflux, allergy, asthma, anxiety, pain or diabetes medications, make sure the surgery center/hospital is aware before the day of surgery so they may notify you which medications to take or avoid the day of surgery. 6. No food or drink after midnight the night before surgery unless directed otherwise by surgical center/hospital staff. 7. No alcoholic beverages 24 hours prior to surgery.  No smoking 24 hours prior to or 24 hours after surgery. 8. Wear loose pants or shorts- loose enough to fit over bandages, boots, and casts. 9. No slip on shoes, sneakers are best. 10. Bring  your boot with you to the surgery center/hospital.  Also bring crutches or a walker if your physician has prescribed it for you.  If you do not have this equipment, it will be provided for you after surgery. 11. If you have not been contracted by the surgery center/hospital by the day before your surgery, call to confirm the date and time of your surgery. 12. Leave-time from work may vary depending on the type of surgery you have.  Appropriate arrangements should be made prior to surgery with your employer. 13. Prescriptions will be provided immediately following surgery by your doctor.  Have these filled as soon as possible after surgery and take the medication as directed. 14. Remove nail polish on the operative foot. 15. Wash the night before surgery.  The night before surgery wash the foot and leg well with the antibacterial soap provided and water paying special attention to beneath the toenails and in between the toes.  Rinse thoroughly with water and dry well with a towel.  Perform this wash unless told not to do so by your physician.  Enclosed: 1 Ice pack (please put in freezer the night before surgery)   1 Hibiclens skin cleaner   Pre-op Instructions  If you have any questions regarding the instructions, do not hesitate to call our office.  [place]: [address] [place], [place] [address] [phone]  [place]: [address] [place], [place] [address] [phone]  [place]: [address]  [place], [place] [address] [phone]   Dr.   Norman Regal DPM, Dr. Matthew Wagoner DPM, Dr. M. Todd Gabreal Worton DPM, Dr. Titorya Stover DPM 

## 2017-05-12 NOTE — Progress Notes (Signed)
She presents today for chronic pain of the right heel. He states that she would like to have surgery as soon as possible but unfortunately for her that will be not until August. She is requesting another injection today. All conservative therapies have failed to alleviate her symptoms.  Objective: Pulses are strongly palpable I have reviewed her past mental history medications allergies social history. She is pain on palpation medial calcaneal interval of the right heel.  Assessment: Chronic intractable fasciitis right.  Plan: We consented her today for an endoscopic plantar fasciotomy of the right heel. We discussed all the possible postoperative palpitations which may include but are not limited to postop pain bleeding swelling infection recurrence and need for further surgery overcorrection under correction recurrence of the condition. She understands this is amenable to it she saw Dr. page and the consent form we did discuss the surgery center as well as anesthesia and I will follow-up with her in the near future.

## 2017-05-13 ENCOUNTER — Encounter: Payer: Self-pay | Admitting: Family Medicine

## 2017-05-13 ENCOUNTER — Ambulatory Visit (INDEPENDENT_AMBULATORY_CARE_PROVIDER_SITE_OTHER): Payer: 59 | Admitting: Family Medicine

## 2017-05-13 VITALS — BP 112/70 | HR 70 | Temp 97.9°F | Ht 67.0 in | Wt 193.2 lb

## 2017-05-13 DIAGNOSIS — E669 Obesity, unspecified: Secondary | ICD-10-CM

## 2017-05-13 DIAGNOSIS — R945 Abnormal results of liver function studies: Secondary | ICD-10-CM | POA: Diagnosis not present

## 2017-05-13 DIAGNOSIS — R7989 Other specified abnormal findings of blood chemistry: Secondary | ICD-10-CM | POA: Diagnosis not present

## 2017-05-13 LAB — COMPREHENSIVE METABOLIC PANEL
ALT: 64 U/L — ABNORMAL HIGH (ref 0–35)
AST: 45 U/L — ABNORMAL HIGH (ref 0–37)
Albumin: 4.9 g/dL (ref 3.5–5.2)
Alkaline Phosphatase: 44 U/L (ref 39–117)
BUN: 14 mg/dL (ref 6–23)
CO2: 29 mEq/L (ref 19–32)
Calcium: 10.4 mg/dL (ref 8.4–10.5)
Chloride: 100 mEq/L (ref 96–112)
Creatinine, Ser: 0.84 mg/dL (ref 0.40–1.20)
GFR: 75.38 mL/min (ref >60.00)
Glucose, Bld: 97 mg/dL (ref 70–99)
Potassium: 4.2 mEq/L (ref 3.5–5.1)
Sodium: 137 mEq/L (ref 135–145)
Total Bilirubin: 0.7 mg/dL (ref 0.2–1.2)
Total Protein: 7.7 g/dL (ref 6.0–8.3)

## 2017-05-13 NOTE — Progress Notes (Signed)
Tricia Barron is a 51 y.o. female is here to [employer].   Patient Care Team: Tricia Deutscher, DO as PCP - General (Family Medicine)   History of Present Illness:  Tricia Barron CMA acting as scribe for Dr. Juleen Barron.  HPI Patient comes in today to establish care. She is coming in today because of concerns of elevated LFT's. They have gradually went up over the last couple months.   Liver Dysfunction Patient complains of an abnormal AST and ALT that has been associated with no obvious evidence for illness. The problem was first noted  a few weeks ago. A positive history was noted for: obesity and alcohol use (social). Patient denies blood transfusion, use of illicit drugs, unprotected  sex, family history  of chronic liver disease, en utero exposure to hepatotrophic virus, foreign travel and family history of genetic liver disease (hemochromatosis, CF, galactosemia), alpha 1 antitrypsin deficiency, cystic fibrosis, glycogen or lipid storage disease, hemolytic anemia, hepatitis A, hepatitis B, hepatitis C, Wilson's disease, or exposure to carbamazepine, phenytoin, valproate, ethanol, street drugs.    Health Maintenance Due  Topic Date Due  . HIV Screening  05/06/1979  . COLONOSCOPY  05/05/2014   Immunization History  Administered Date(s) Administered  . Tdap 02/28/2017   PMHx, SurgHx, SocialHx, Medications, and Allergies were reviewed in the Visit Navigator and updated as appropriate.   Past Medical History:  Diagnosis Date  . Abnormal Pap smear of cervix   . At risk for breast cancer 03/04/2014   High: 20% or greater/Tyrer-Cuzick Model   . Fibroid   . Migraines   . Squamous cell skin cancer    Past Surgical History:  Procedure Laterality Date  . AUGMENTATION MAMMAPLASTY Bilateral 1992  . PLANTAR FASCIA SURGERY     Family History  Problem Relation Age of Onset  . Breast cancer Mother 60       stage 2  . Alcohol abuse Mother   . Mental illness Mother   . Mental illness  Maternal Grandmother   . Lupus Maternal Grandfather    Social History  Substance Use Topics  . Smoking status: Never Smoker  . Smokeless tobacco: Never Used  . Alcohol use 1.8 oz/week    3 Standard drinks or equivalent per week   Current Medications and Allergies:   .  calcium-vitamin D (OSCAL WITH D) 500-200 MG-UNIT tablet, Take 1 tablet by mouth., Disp: , Rfl:  .  diclofenac sodium (VOLTAREN) 1 % GEL, Apply 4 g topically 4 (four) times daily., Disp: 100 g, Rfl: 3 .  Glucosamine HCl (GLUCOSAMINE PO), Take by mouth daily., Disp: , Rfl:  .  Multiple Vitamins-Minerals (MULTIVITAMIN PO), Take by mouth daily., Disp: , Rfl:  .  Omega 3-6-9 Fatty Acids (OMEGA-3-6-9 PO), Take by mouth., Disp: , Rfl:  .  SUMAtriptan (IMITREX) 50 MG tablet, Take 1 tablet po prn migraine, may repeat x 1 in 2 hours prn, Disp: 10 tablet, Rfl: 2  Allergies  Allergen Reactions  . Phenergan [Promethazine Hcl] Anxiety   Review of Systems:   Review of Systems  Constitutional: Negative for chills, fever and malaise/fatigue.  HENT: Negative for ear pain, sinus pain and sore throat.   Eyes: Negative for blurred vision and double vision.  Respiratory: Negative for cough, shortness of breath and wheezing.   Cardiovascular: Negative for chest pain, palpitations and leg swelling.  Gastrointestinal: Negative for abdominal pain, nausea and vomiting.  Musculoskeletal: Negative for back pain and joint pain.  Neurological: Negative for dizziness.  Psychiatric/Behavioral: Negative for depression, hallucinations and memory loss.   Vitals:   Vitals:   05/13/17 0743  BP: 112/70  Pulse: 70  Temp: 97.9 F (36.6 C)  TempSrc: Oral  SpO2: 96%  Weight: 193 lb 3.2 oz (87.6 kg)  Height: 5\' 7"  (1.702 m)     Body mass index is 30.26 kg/m.  Physical Exam:   Physical Exam  Constitutional: She appears well-developed and well-nourished. No distress.  HENT:  Head: Normocephalic and atraumatic.  Eyes: EOM are normal. Pupils  are equal, round, and reactive to light.  Neck: Normal range of motion. Neck supple.  Cardiovascular: Normal rate, regular rhythm, normal heart sounds and intact distal pulses.   Pulmonary/Chest: Effort normal.  Abdominal: Soft. Bowel sounds are normal.  Skin: Skin is warm.  Psychiatric: She has a normal mood and affect. Her behavior is normal.  Nursing note and vitals reviewed.   Results for orders placed or performed in visit on 05/02/17  Hepatic function panel  Result Value Ref Range   Total Protein 6.7 6.0 - 8.5 g/dL   Albumin 4.3 3.5 - 5.5 g/dL   Bilirubin Total 0.5 0.0 - 1.2 mg/dL   Bilirubin, Direct 0.15 0.00 - 0.40 mg/dL   Alkaline Phosphatase 51 39 - 117 IU/L   AST 42 (H) 0 - 40 IU/L   ALT 55 (H) 0 - 32 IU/L    Assessment and Plan:   Tricia Barron was seen today for establish care.  Diagnoses and all orders for this visit:  Elevated LFTs Comments: Will advance work up. Likely fatty liver. Review risk reduction. Orders: -     Hepatitis B surface antibody; Future -     Comprehensive metabolic panel -     US Abdomen Limited RUQ; Future -     Hepatitis B surface antibody  Obesity (BMI 30-39.9) Comments: The patient is asked to make an attempt to improve diet and exercise patterns to aid in medical management of this problem.     . Reviewed expectations re: course of current medical issues. . Discussed self-management of symptoms. . Outlined signs and symptoms indicating need for more acute intervention. . Patient verbalized understanding and all questions were answered. Marland Kitchen Health Maintenance issues including appropriate healthy diet, exercise, and smoking avoidance were discussed with patient. . See orders for this visit as documented in the electronic medical record. . Patient received an After Visit Summary.  CMA served as Education administrator during this visit. History, Physical, and Plan performed by medical provider. The above documentation has been reviewed and is accurate and  complete. Tricia Barron, D.O.  Tricia Deutscher, DO [place], [place] 05/13/2017  Future Appointments Date Time Provider [place]  05/19/2017 8:00 AM [facility] 1 [facility] [place]  08/15/2017 1:00 PM Tricia Deutscher, DO [facility] None  03/01/2018 1:15 PM Salvadore Dom, MD [place] None

## 2017-05-14 LAB — HEPATITIS B SURFACE ANTIBODY,QUALITATIVE: Hep B S Ab: NEGATIVE

## 2017-05-19 ENCOUNTER — Ambulatory Visit (HOSPITAL_COMMUNITY)
Admission: RE | Admit: 2017-05-19 | Discharge: 2017-05-19 | Disposition: A | Discharge status: Home / Self Care | Payer: 59 | Source: Ambulatory Visit | Attending: Family Medicine | Admitting: Family Medicine

## 2017-05-19 DIAGNOSIS — K760 Fatty (change of) liver, not elsewhere classified: Secondary | ICD-10-CM | POA: Insufficient documentation

## 2017-05-19 DIAGNOSIS — R7989 Other specified abnormal findings of blood chemistry: Secondary | ICD-10-CM | POA: Diagnosis not present

## 2017-05-19 DIAGNOSIS — R945 Abnormal results of liver function studies: Secondary | ICD-10-CM

## 2017-05-23 ENCOUNTER — Encounter: Payer: Self-pay | Admitting: Family Medicine

## 2017-06-06 ENCOUNTER — Telehealth: Payer: Self-pay | Admitting: Family Medicine

## 2017-06-06 NOTE — Telephone Encounter (Signed)
Patient would like referral to hepatologist per last message with provider. Would also like to know if a rx for something for "sea-sickness" can be called in, as she is going on a cruise soon.

## 2017-06-07 ENCOUNTER — Other Ambulatory Visit: Payer: Self-pay

## 2017-06-07 DIAGNOSIS — K760 Fatty (change of) liver, not elsewhere classified: Secondary | ICD-10-CM

## 2017-06-07 NOTE — Telephone Encounter (Signed)
Referral placed.  Please advise on Rx.

## 2017-06-08 MED ORDER — SCOPOLAMINE 1 MG/3DAYS TD PT72: 1.0000 | MEDICATED_PATCH | TRANSDERMAL | 0 refills | Status: DC

## 2017-06-08 NOTE — Telephone Encounter (Signed)
Scopolamine patch sent in.

## 2017-06-08 NOTE — Telephone Encounter (Signed)
Left message informing patient.  Patient identified self on voicemail.

## 2017-06-29 ENCOUNTER — Telehealth: Payer: Self-pay | Admitting: *Deleted

## 2017-06-29 NOTE — Telephone Encounter (Signed)
"  I have Plantar Fasciitis surgery scheduled for August 24.  I want to cancel my surgery.  My foot is getting better."  I attempted to return her call.  I left her a message to call if she has any questions or concerns.  I will cancel surgery with the surgical center.    I called [facility] and canceled her surgery.

## 2017-07-06 DIAGNOSIS — R748 Abnormal levels of other serum enzymes: Secondary | ICD-10-CM | POA: Diagnosis not present

## 2017-07-06 DIAGNOSIS — K760 Fatty (change of) liver, not elsewhere classified: Secondary | ICD-10-CM | POA: Diagnosis not present

## 2017-07-15 ENCOUNTER — Telehealth: Payer: Self-pay | Admitting: Family Medicine

## 2017-07-15 NOTE — Telephone Encounter (Signed)
Patient called in regarding a [facility] bill she received for hep b test dated 05/13/2017. We originally spoke on 06/21/2017 regarding this matter, but that encounter did not get noted.   On 06/21/2017, [facility] advised me that the claim was denied as experimental. I faxed all records to them to file an appeal. They refiled the claim. I left a vm on the patients vm to advise.   Today, the patient called to advise that she received the same bill for [facility] invoice [id number] for $72.50. I called [facility], and they advised that the claim was denied again, even after refile. They advised that the patient needs to file an appeal. I called the patient to advise of this. I faxed all relevant documentation to her @ [phone] and received confirmation. The patient understood that her next step is to appeal with her insurance company. There is nothing further at this point that we can provide, as the provider did deem the test necessary based on her interpretation.

## 2017-07-27 ENCOUNTER — Encounter: Payer: Self-pay | Admitting: Family Medicine

## 2017-08-09 ENCOUNTER — Telehealth: Payer: Self-pay | Admitting: *Deleted

## 2017-08-09 NOTE — Telephone Encounter (Signed)
Pt presents with 2 plantar fascial braces with broken bottom braces, pt was wearing a older brace on the right foot, that she said was a original brace that had wear at the contact spot of the bottom strap but no tearing. I offered the larger braces in hopes of pt not having to pullthe bottom strap tight to attach and pt did not want because rubbed the top of the foot and she was not able to move plantar strap forward, due to overlapping the fasteners to the ankle wrap portion. I told pt she should also wear socks with the plantar fascial braces, pt states she usually does, but is too hot now. Pt left with 2 of the plantar fascial braces small/medium.

## 2017-08-15 ENCOUNTER — Ambulatory Visit: Payer: 59 | Admitting: Family Medicine

## 2017-08-17 DIAGNOSIS — Z23 Encounter for immunization: Secondary | ICD-10-CM | POA: Diagnosis not present

## 2017-10-21 ENCOUNTER — Telehealth: Payer: Self-pay

## 2017-10-21 NOTE — Telephone Encounter (Signed)
Copied from [place] [id number]. Topic: Inquiry >> Oct 21, 2017  2:14 PM Corie Chiquito, [place] wrote: Reason for CRM: Patient called to speak with Denny Peon about a bill that she was working on for her and would like for her to give her a call back.

## 2017-11-04 ENCOUNTER — Telehealth: Payer: Self-pay

## 2017-11-04 NOTE — Telephone Encounter (Signed)
Can you please call this patient? She said that she was told that the hep b test was ordered incorrectly.  I am still waiting to hear back  from [place] at [place].

## 2017-11-04 NOTE — Telephone Encounter (Signed)
I called on behalf of Tricia Barron and notified the patient that our office is waiting to hear back from Tricia Barron and once we have an update we will give her a call. Patient verbally stated she understood. No further action required until we heard back from [facility].

## 2017-11-04 NOTE — Telephone Encounter (Signed)
Copied from [place] [id number]. Topic: Inquiry >> Oct 21, 2017  2:14 PM Corie Chiquito, [place] wrote: Reason for CRM: Patient called to speak with Denny Peon about a bill that she was working on for her and would like for her to give her a call back.  >> Nov 04, 2017 10:07 AM Robina Ade, Helene Kelp D wrote: Patient called very upset because she has been dealing with a Bill and the office has not yet called her back or fixed the problem. Patient said she wants a call today from Environmental education officer or Denny Peon. Please call patient back, thanks.

## 2017-11-08 DIAGNOSIS — R748 Abnormal levels of other serum enzymes: Secondary | ICD-10-CM | POA: Diagnosis not present

## 2017-11-08 DIAGNOSIS — K760 Fatty (change of) liver, not elsewhere classified: Secondary | ICD-10-CM | POA: Diagnosis not present

## 2017-11-09 ENCOUNTER — Other Ambulatory Visit: Payer: Self-pay | Admitting: Nurse Practitioner

## 2017-11-09 DIAGNOSIS — K760 Fatty (change of) liver, not elsewhere classified: Secondary | ICD-10-CM

## 2017-11-10 ENCOUNTER — Ambulatory Visit
Admission: RE | Admit: 2017-11-10 | Discharge: 2017-11-10 | Disposition: A | Discharge status: Home / Self Care | Payer: 59 | Source: Ambulatory Visit | Attending: Nurse Practitioner | Admitting: Nurse Practitioner

## 2017-11-10 DIAGNOSIS — R945 Abnormal results of liver function studies: Secondary | ICD-10-CM | POA: Diagnosis not present

## 2017-11-10 DIAGNOSIS — K760 Fatty (change of) liver, not elsewhere classified: Secondary | ICD-10-CM

## 2017-11-30 NOTE — Telephone Encounter (Signed)
Patient called to check the status of the billing situation with [facility]. I spoke with the patient and explained that we have done everything on our end with resubmitting the correct information to [facility] however, we have not received an update from them which is why she is continuing to get the statements. I advised her to hold onto the statements and once we receive an update our office will contact her.   Patient also stated that she wanted her records sent to a provider at [facility] and I provided the medical records number for them to assist [phone].  Patient verbally stated she understood to hold onto any statements that are sent to her and that we were waiting to hear back from [facility].   No further action required until hearing back from [place].

## 2017-12-14 ENCOUNTER — Telehealth: Payer: Self-pay | Admitting: Surgical

## 2017-12-14 NOTE — Telephone Encounter (Signed)
Spoke with patient about referral. She stated that the liver doctor has referred her to do a clinical trial in [place]. Patient stated that she has researched and would like to go to [place] for there clinical trial. I explained to the patient that she may need to come in for an appointment due to not seeing her since June of 2018. She stated that she does not understand why she needs to come in since she only came to Dr. Juleen China for elevated LFT and she sent her to a liver doctor. I explained to give good quality care the doctor does have to see her. I explained again that I would send the message to Dr. Juleen China to see if she would place the referral without an appointment. She then stated that she has bad insurance and does not want to come in for an appointment if she doesn't need to. I told her that I would get back with her as soon as Dr. Juleen China lets me know.

## 2017-12-14 NOTE — Telephone Encounter (Signed)
Copied from [place] #[id number]. Topic: Referral - Status >> Dec 14, 2017  2:00 PM Arletha Grippe wrote: Reason for CRM: pt needs to have form /referral filled out and sent [facility] phone [phone], fax [phone], ask for sharon Form was sent on jan14.   Cb for pt [phone]

## 2017-12-14 NOTE — Telephone Encounter (Signed)
Sending to you to call patient as we discussed.

## 2017-12-14 NOTE — Telephone Encounter (Signed)
Pt is calling about an update on her bill. Please call [phone] . She would like to know what is going on.

## 2017-12-15 DIAGNOSIS — Z1231 Encounter for screening mammogram for malignant neoplasm of breast: Secondary | ICD-10-CM | POA: Diagnosis not present

## 2017-12-15 NOTE — Telephone Encounter (Signed)
Lea please contact patient to advise.

## 2017-12-16 NOTE — Telephone Encounter (Signed)
I called patient to let her know that I have not heard back from [facility] about the billing issue with her labs. I also called to let her know that she would need to make an appointment with Dr. Juleen China to follow up and if she needs a new referral. I left message for patient to call back if she has any questions.

## 2017-12-18 IMAGING — MG 2D DIGITAL SCREENING BILATERAL MAMMOGRAM WITH IMPLANTS, CAD AND
8 of 16 series · 8 of 32 positions shown · non-contrast
Comparison: Previous exam(s).

CLINICAL DATA: Screening.

EXAM:
2D DIGITAL SCREENING BILATERAL MAMMOGRAM WITH IMPLANTS, CAD AND
ADJUNCT TOMO
The patient has retropectoral implants. Standard and implant
displaced views were performed.

[R CC (1 of 2)]
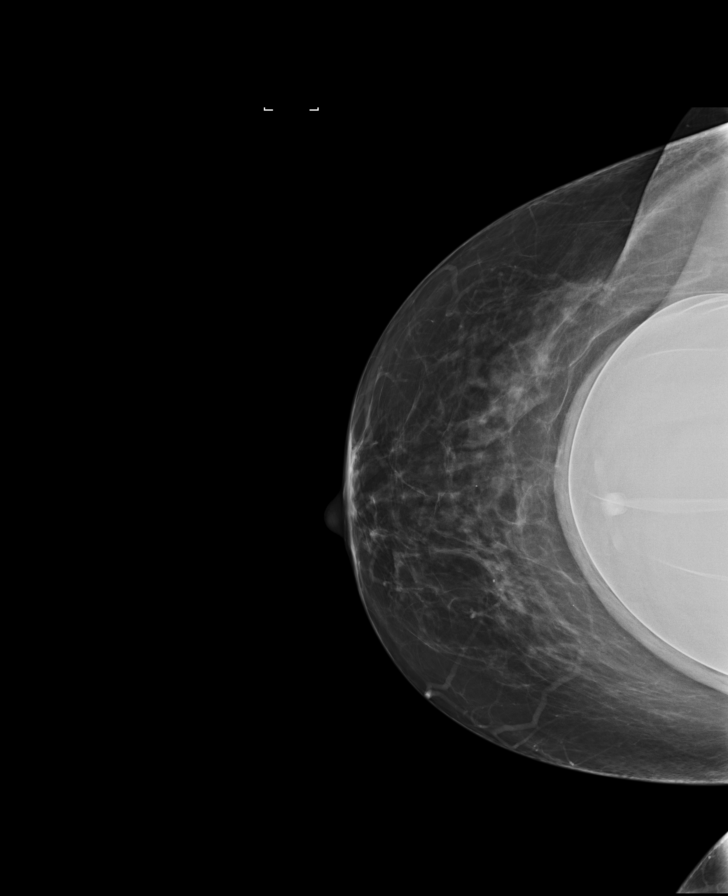

[L CC]
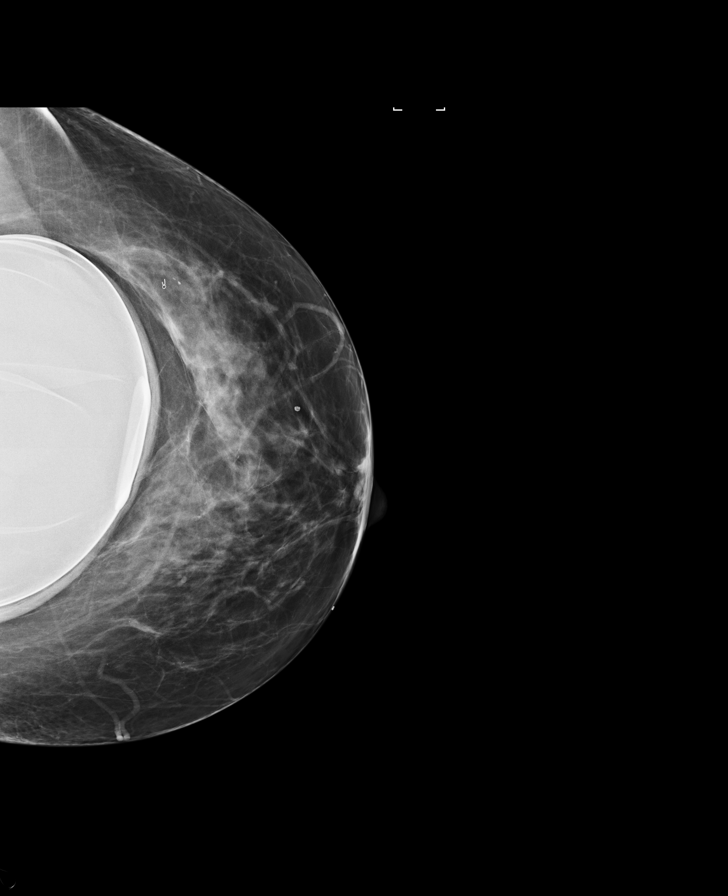

[R MLO]
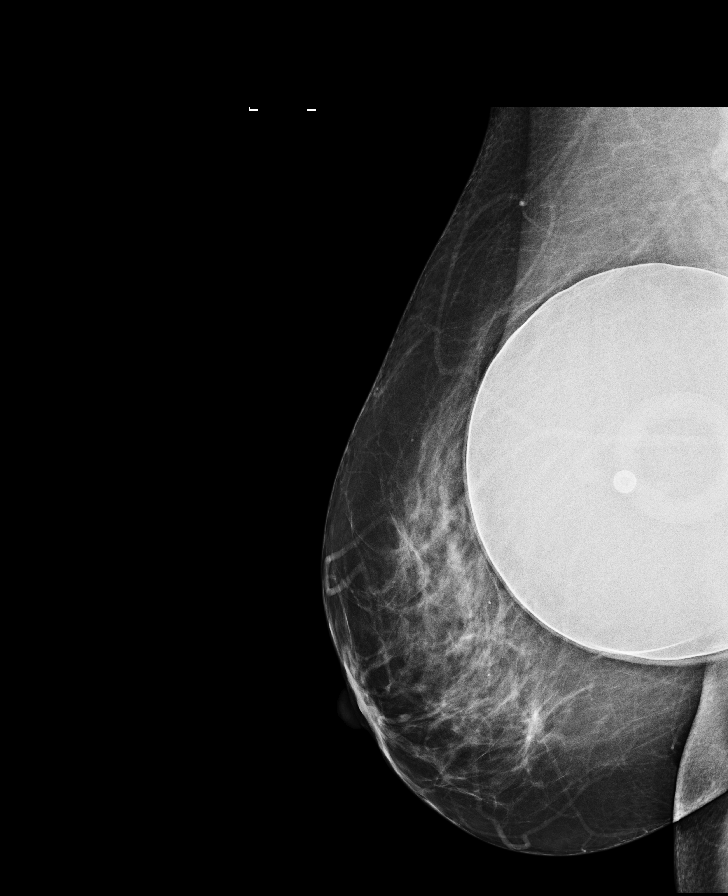

[L MLO]
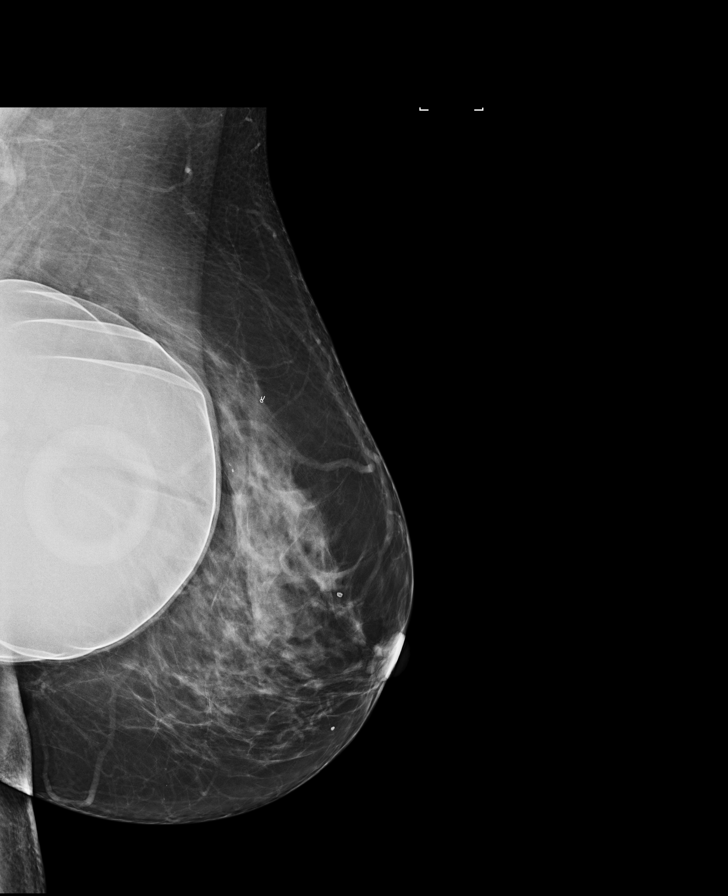

[L MLO synth-2D]
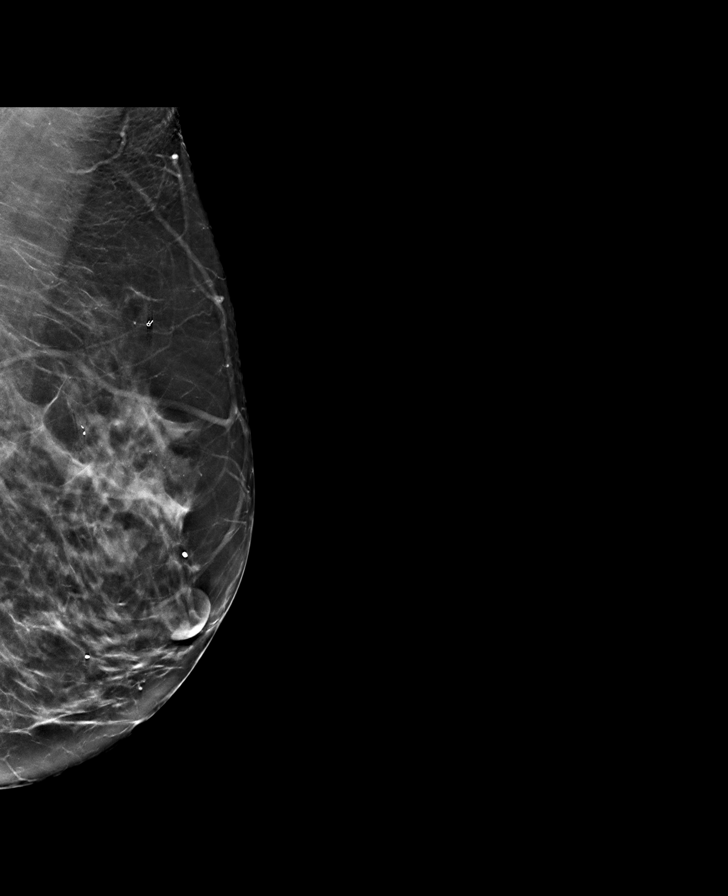

[R MLO synth-2D]
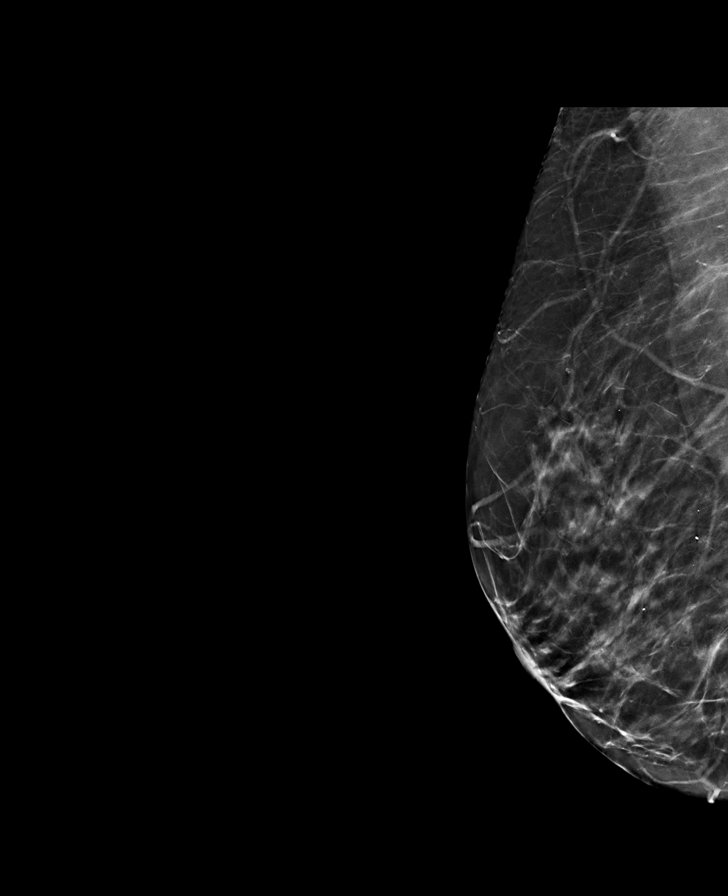

[R CC (2 of 2)]
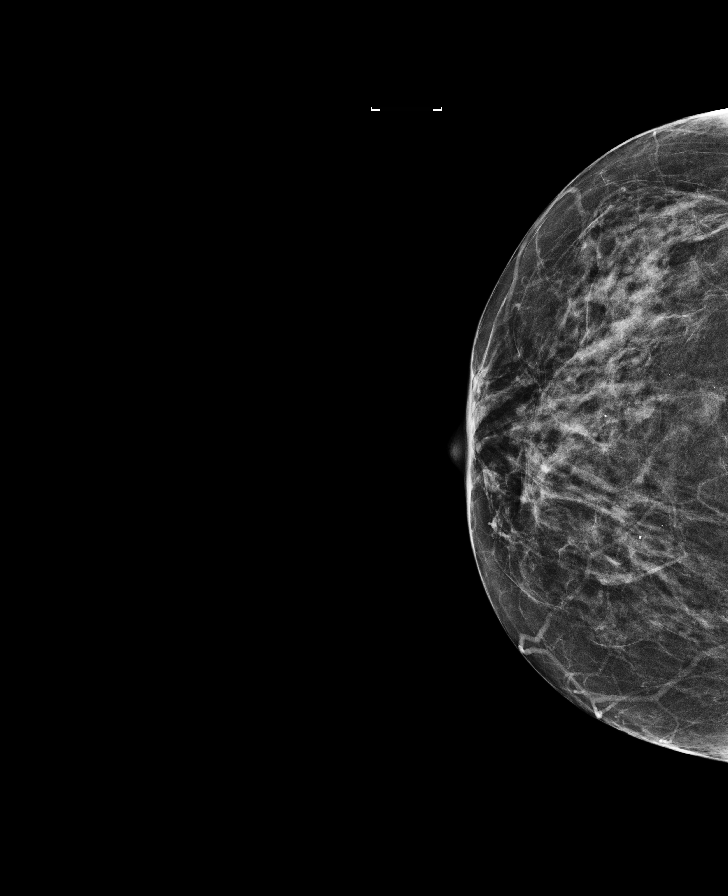

[L CC synth-2D]
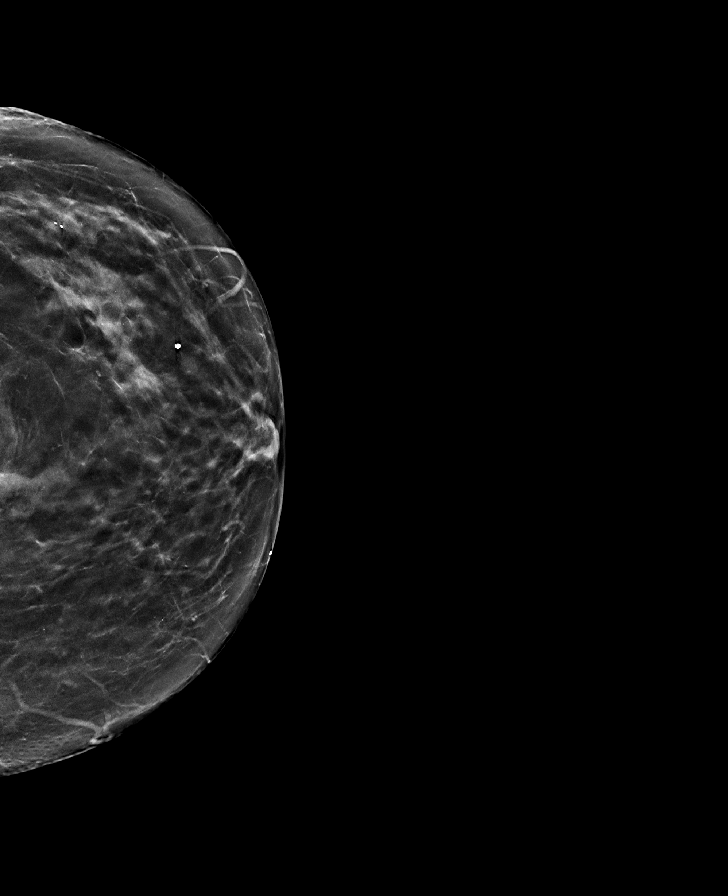

[8 of 32 positions shown; findings below may reference images not displayed]

ACR Breast Density Category b: There are scattered areas of
fibroglandular density.
FINDINGS: There are no findings suspicious for malignancy. Images were
processed with CAD.
IMPRESSION: No mammographic evidence of malignancy. A result letter of this
screening mammogram will be mailed directly to the patient.

RECOMMENDATION:
Screening mammogram in one year. (Code:H6-J-DBW)

BI-RADS CATEGORY  1:  Negative.

## 2017-12-19 NOTE — Telephone Encounter (Signed)
Noted  

## 2017-12-28 DIAGNOSIS — R748 Abnormal levels of other serum enzymes: Secondary | ICD-10-CM | POA: Diagnosis not present

## 2018-01-09 ENCOUNTER — Encounter: Payer: Self-pay | Admitting: Obstetrics and Gynecology

## 2018-01-28 DIAGNOSIS — Z23 Encounter for immunization: Secondary | ICD-10-CM | POA: Diagnosis not present

## 2018-02-17 DIAGNOSIS — K760 Fatty (change of) liver, not elsewhere classified: Secondary | ICD-10-CM | POA: Diagnosis not present

## 2018-03-01 ENCOUNTER — Ambulatory Visit: Payer: 59 | Admitting: Obstetrics and Gynecology

## 2018-03-16 ENCOUNTER — Ambulatory Visit (INDEPENDENT_AMBULATORY_CARE_PROVIDER_SITE_OTHER): Payer: 59 | Admitting: Obstetrics and Gynecology

## 2018-03-16 ENCOUNTER — Other Ambulatory Visit: Payer: Self-pay

## 2018-03-16 ENCOUNTER — Encounter: Payer: Self-pay | Admitting: Obstetrics and Gynecology

## 2018-03-16 VITALS — BP 110/60 | HR 56 | Resp 14 | Ht 67.0 in | Wt 163.0 lb

## 2018-03-16 DIAGNOSIS — E559 Vitamin D deficiency, unspecified: Secondary | ICD-10-CM

## 2018-03-16 DIAGNOSIS — R748 Abnormal levels of other serum enzymes: Secondary | ICD-10-CM | POA: Diagnosis not present

## 2018-03-16 DIAGNOSIS — Z01419 Encounter for gynecological examination (general) (routine) without abnormal findings: Secondary | ICD-10-CM | POA: Diagnosis not present

## 2018-03-16 DIAGNOSIS — Z0000 Encounter for general adult medical examination without abnormal findings: Secondary | ICD-10-CM

## 2018-03-16 NOTE — Progress Notes (Signed)
52 y.o. [id number] MarriedCaucasianF here for annual exam.   She has an elevated risk of breast cancer and is getting yearly MRI's and mammograms She is retired, takes care of her elderly Mom (has dementia, doing better here).  Her LFT's were mildly elevated last year. She ended up seeing a primary MD and then a Hepatologist. She had a liver biopsy, has a mild fatty liver (couldn't tell on just u/s). She will f/u with the hepatologist.  Still has vasomotor symptoms are tolerable. She has vaginal dryness, no pain, lubricant helps. No vaginal bleeding.     Patient's last menstrual period was 12/24/2007.          Sexually active: Yes.    The current method of family planning is vasectomy and post menopausal status.    Exercising: Yes.    YOGA/ walking Smoker:  no  Health Maintenance: Pap:  02-23-16 WNL NEG HR HPV 12-09-11 WNL NEG HR HPV  History of abnormal Pap:  Yes 25 years ago MMG:  12-15-17 WNL, MRI 04/04/17 WNL Colonoscopy:  2016 WNL per patient  BMD:   07-10-13 Normal TDaP:  02-28-17 Gardasil: N/A   reports that she has never smoked. She has never used smokeless tobacco. She reports that she drank alcohol. She reports that she does not use drugs.  Son is studying in [place], will be applying to medical school. Daughter is in [place] teaching Production designer, theatre/television/film)  Past Medical History:  Diagnosis Date  . Abnormal Pap smear of cervix   . At risk for breast cancer 03/04/2014   High: 20% or greater/Tyrer-Cuzick Model   . Fibroid   . Migraines   . NAFLD (nonalcoholic fatty liver disease)   . Squamous cell skin cancer     Past Surgical History:  Procedure Laterality Date  . AUGMENTATION MAMMAPLASTY Bilateral 1992  . PLANTAR FASCIA SURGERY      Current Outpatient Medications  Medication Sig Dispense Refill  . calcium-vitamin D (OSCAL WITH D) 500-200 MG-UNIT tablet Take 1 tablet by mouth.    . Multiple Vitamins-Minerals (MULTIVITAMIN PO) Take by mouth daily.    . SUMAtriptan (IMITREX) 50  MG tablet Take 1 tablet po prn migraine, may repeat x 1 in 2 hours prn 10 tablet 2  . vitamin E 200 UNIT capsule Take 200 Units by mouth daily.     No current facility-administered medications for this visit.     Family History  Problem Relation Age of Onset  . Breast cancer Mother 89       stage 2  . Alcohol abuse Mother   . Mental illness Mother   . Mental illness Maternal Grandmother   . Lupus Maternal Grandfather     Review of Systems  Constitutional: Negative.   HENT: Negative.   Eyes: Negative.   Respiratory: Negative.   Cardiovascular: Negative.   Gastrointestinal: Negative.   Endocrine: Negative.   Genitourinary: Negative.   Musculoskeletal: Negative.   Skin: Negative.   Allergic/Immunologic: Negative.   Neurological: Negative.   Psychiatric/Behavioral: Negative.     Exam:   BP 110/60 (BP Location: Right Arm, Patient Position: Sitting, Cuff Size: Normal)   Pulse (!) 56   Resp 14   Ht 5\' 7"  (1.702 m)   Wt 163 lb (73.9 kg)   LMP 12/24/2007   BMI 25.53 kg/m   Weight change: @WEIGHTCHANGE @ Height:   Height: 5\' 7"  (170.2 cm)  Ht Readings from Last 3 Encounters:  03/16/18 5\' 7"  (1.702 m)  05/13/17 5\' 7"  (1.702 m)  02/28/17 5\' 7"  (1.702 m)    General appearance: alert, cooperative and appears stated age Head: Normocephalic, without obvious abnormality, atraumatic Neck: no adenopathy, supple, symmetrical, trachea midline and thyroid normal to inspection and palpation Lungs: clear to auscultation bilaterally Cardiovascular: regular rate and rhythm Breasts: normal appearance, no masses or tenderness, bilateral implants Abdomen: soft, non-tender; non distended,  no masses,  no organomegaly Extremities: extremities normal, atraumatic, no cyanosis or edema Skin: Skin color, texture, turgor normal. No rashes or lesions Lymph nodes: Cervical, supraclavicular, and axillary nodes normal. No abnormal inguinal nodes palpated Neurologic: Grossly normal   Pelvic:  External genitalia:  no lesions              Urethra:  normal appearing urethra with no masses, tenderness or lesions              Bartholins and Skenes: normal                 Vagina: normal appearing vagina with normal color and discharge, no lesions              Cervix: no lesions               Bimanual Exam:  Uterus:  normal size, contour, position, consistency, mobility, non-tender              Adnexa: no mass, fullness, tenderness               Rectovaginal: Confirms               Anus:  normal sphincter tone, no lesions  Chaperone was present for exam.  A:  Well Woman with normal exam  Elevated risk of breast cancer, mammogram UTD, due for MRI in 5/19  Vit d def  P:   Screening labs (won't do lipids, recently normal)  Discussed breast self exam  Discussed calcium and vit D intake  MRI due in 5/19  Pap next year

## 2018-03-16 NOTE — Patient Instructions (Signed)

## 2018-03-17 LAB — COMPREHENSIVE METABOLIC PANEL
ALK PHOS: 63 IU/L (ref 39–117)
ALT: 27 IU/L (ref 0–32)
AST: 33 IU/L (ref 0–40)
Albumin/Globulin Ratio: 1.9 (ref 1.2–2.2)
Albumin: 4.8 g/dL (ref 3.5–5.5)
BUN/Creatinine Ratio: 15 (ref 9–23)
BUN: 12 mg/dL (ref 6–24)
Bilirubin Total: 0.5 mg/dL (ref 0.0–1.2)
CO2: 28 mmol/L (ref 20–29)
CREATININE: 0.78 mg/dL (ref 0.57–1.00)
Calcium: 10.2 mg/dL (ref 8.7–10.2)
Chloride: 101 mmol/L (ref 96–106)
GFR calc Af Amer: 100 mL/min/{1.73_m2} (ref >59)
GFR calc non Af Amer: 87 mL/min/{1.73_m2} (ref >59)
GLUCOSE: 78 mg/dL (ref 65–99)
Globulin, Total: 2.5 g/dL (ref 1.5–4.5)
Potassium: 4.1 mmol/L (ref 3.5–5.2)
SODIUM: 142 mmol/L (ref 134–144)
Total Protein: 7.3 g/dL (ref 6.0–8.5)

## 2018-03-17 LAB — CBC
Hematocrit: 44.1 % (ref 34.0–46.6)
Hemoglobin: 14.6 g/dL (ref 11.1–15.9)
MCH: 30.9 pg (ref 26.6–33.0)
MCHC: 33.1 g/dL (ref 31.5–35.7)
MCV: 93 fL (ref 79–97)
PLATELETS: 243 10*3/uL (ref 150–379)
RBC: 4.73 x10E6/uL (ref 3.77–5.28)
RDW: 13.2 % (ref 12.3–15.4)
WBC: 7.1 10*3/uL (ref 3.4–10.8)

## 2018-03-17 LAB — VITAMIN D 25 HYDROXY (VIT D DEFICIENCY, FRACTURES): Vit D, 25-Hydroxy: 34.5 ng/mL (ref 30.0–100.0)

## 2018-03-21 ENCOUNTER — Telehealth: Payer: Self-pay

## 2018-03-21 NOTE — Telephone Encounter (Signed)
Spoke with patient. Patient would like to have IV sedation for breast MRI. Usually does this in [place], but would like to do this more locally if possible. Patient was scheduled last year at [facility] for this, but decided to have this in [place] due not wanting to do this in a hospital setting. [facility] in [place] administers light sedation for breast MRI's. Patient would like to speak with center in [place] to see what type of medication they use for sedation before proceeding with scheduling. If patient would like to proceed RN will need to fax order, last MMG, and last OV to [phone] for scheduling.

## 2018-03-21 NOTE — Telephone Encounter (Signed)
Left message to call Zhaniya Swallows at [phone]. 

## 2018-03-21 NOTE — Telephone Encounter (Signed)
-----   Message from Salvadore Dom, MD sent at 03/19/2018 12:13 PM EDT ----- Sandrea Hughs, Last year you helped me tried to find a place where this patient could have a breast MRI with sedation. She ended up going to [place] to do it. I can't remember if we tried [facility] or [facility]? What about the option of an open MRI. She really doesn't want to travel, but is claustrophobic. Thanks for your help! Sharee Pimple

## 2018-03-21 NOTE — Telephone Encounter (Signed)
Return call to Kaitlyn. °

## 2018-03-22 NOTE — Telephone Encounter (Signed)
Patient returning call.

## 2018-03-22 NOTE — Telephone Encounter (Signed)
Spoke with patient who would like to proceed with scheduling MRI in [place]. Advised will fax all required information for scheduling to [facility] for scheduling. Patient is agreeable. Patient requests Thursday or Friday afternoon in late June.

## 2018-03-24 NOTE — Telephone Encounter (Signed)
Spoke with patient. Advised she will be contacted directly by [facility] to schedule breast MRI. Patient verbalizes understanding.  Routing to provider for final review. Patient agreeable to disposition. Will close encounter.

## 2018-04-18 DIAGNOSIS — L565 Disseminated superficial actinic porokeratosis (DSAP): Secondary | ICD-10-CM | POA: Diagnosis not present

## 2018-04-18 DIAGNOSIS — Z85828 Personal history of other malignant neoplasm of skin: Secondary | ICD-10-CM | POA: Diagnosis not present

## 2018-04-18 DIAGNOSIS — L570 Actinic keratosis: Secondary | ICD-10-CM | POA: Diagnosis not present

## 2018-05-10 ENCOUNTER — Telehealth: Payer: Self-pay | Admitting: Obstetrics and Gynecology

## 2018-05-10 NOTE — Telephone Encounter (Signed)
Tricia Barron from [facility] left a voicemail requesting any reports from prior MRI's.

## 2018-05-10 NOTE — Telephone Encounter (Signed)
Spoke with Anderson Malta at [facility]. She is aware there are no MRI imaging reports for the patient.

## 2018-05-10 NOTE — Telephone Encounter (Signed)
Routing to [place] For assistance with records.

## 2018-05-15 ENCOUNTER — Telehealth: Payer: Self-pay | Admitting: Obstetrics and Gynecology

## 2018-05-15 NOTE — Telephone Encounter (Signed)
Patient set up an MRI appointment with sedation. She was not sure what location nor did she receive any information in the mail. I let her know that it was [facility] and gave her the phone number [phone] that was found on their website, to call.

## 2018-05-19 ENCOUNTER — Encounter: Payer: Self-pay | Admitting: Obstetrics and Gynecology

## 2018-05-19 DIAGNOSIS — Z1239 Encounter for other screening for malignant neoplasm of breast: Secondary | ICD-10-CM | POA: Diagnosis not present

## 2018-05-19 DIAGNOSIS — Z803 Family history of malignant neoplasm of breast: Secondary | ICD-10-CM | POA: Diagnosis not present

## 2018-05-30 DIAGNOSIS — J029 Acute pharyngitis, unspecified: Secondary | ICD-10-CM | POA: Diagnosis not present

## 2018-06-14 ENCOUNTER — Telehealth: Payer: Self-pay | Admitting: Obstetrics and Gynecology

## 2018-06-14 NOTE — Telephone Encounter (Signed)
Patient stated that she had an MRI done in [facility], but never received results. Patient stated that she called and was told to call our office as the results were sent to [place].

## 2018-06-14 NOTE — Telephone Encounter (Signed)
Reviewed with Dr Talbert Nan, advised patient MRI within normal limits.  Explained we had expected breast imaging results to come from facility and apologized for delay.   Routing to provider for final review.  Will close encounter.

## 2018-06-14 NOTE — Telephone Encounter (Signed)
MRI on 05-19-18 received and scanned. Patient states facility advised her to contact us for results.  Please advise.

## 2018-06-22 DIAGNOSIS — L570 Actinic keratosis: Secondary | ICD-10-CM | POA: Diagnosis not present

## 2018-07-13 DIAGNOSIS — Z0000 Encounter for general adult medical examination without abnormal findings: Secondary | ICD-10-CM | POA: Diagnosis not present

## 2018-07-13 DIAGNOSIS — Z13220 Encounter for screening for lipoid disorders: Secondary | ICD-10-CM | POA: Diagnosis not present

## 2018-08-08 DIAGNOSIS — Z23 Encounter for immunization: Secondary | ICD-10-CM | POA: Diagnosis not present

## 2018-08-17 ENCOUNTER — Other Ambulatory Visit: Payer: Self-pay | Admitting: Obstetrics and Gynecology

## 2018-08-17 MED ORDER — SUMATRIPTAN SUCCINATE 50 MG PO TABS: ORAL_TABLET | ORAL | 2 refills | Status: DC

## 2018-08-17 NOTE — Telephone Encounter (Signed)
Patient called to see if we can expedite this request for Imitrex 15 MG. Patient is traveling out of town at 2:00 PM this afternoon and said she's had a migraine for 3 days. Pharmacy on file confirmed.

## 2018-08-17 NOTE — Telephone Encounter (Signed)
Reviewed with Dr. Talbert Nan. Rx for Imitrex 50 mg tab #10/2RF to [facility].   Patient notified.   Encounter closed.

## 2018-08-17 NOTE — Telephone Encounter (Signed)
Dr. Talbert Nan -please advise on Imitrex refill.   Last filled 02/28/17 #10/2RF Last AEX 02/24/18 Next AEX 03/22/19

## 2018-08-17 NOTE — Telephone Encounter (Signed)
Patient calling for refill on Imitrex.

## 2018-08-17 NOTE — Telephone Encounter (Signed)
Routing to triage after discussing with nurse Sharee Pimple.

## 2018-09-27 DIAGNOSIS — L570 Actinic keratosis: Secondary | ICD-10-CM | POA: Diagnosis not present

## 2018-10-24 DIAGNOSIS — L570 Actinic keratosis: Secondary | ICD-10-CM | POA: Diagnosis not present

## 2018-11-23 ENCOUNTER — Ambulatory Visit: Payer: 59 | Admitting: Orthotics

## 2018-11-23 DIAGNOSIS — M722 Plantar fascial fibromatosis: Secondary | ICD-10-CM

## 2018-11-23 NOTE — Progress Notes (Signed)
reglued cover on f/o..happy

## 2018-12-04 ENCOUNTER — Other Ambulatory Visit: Payer: Self-pay | Admitting: Obstetrics and Gynecology

## 2018-12-04 ENCOUNTER — Telehealth: Payer: Self-pay | Admitting: Obstetrics and Gynecology

## 2018-12-04 DIAGNOSIS — Z1231 Encounter for screening mammogram for malignant neoplasm of breast: Secondary | ICD-10-CM

## 2018-12-26 DIAGNOSIS — B349 Viral infection, unspecified: Secondary | ICD-10-CM | POA: Diagnosis not present

## 2018-12-26 DIAGNOSIS — R05 Cough: Secondary | ICD-10-CM | POA: Diagnosis not present

## 2019-01-03 DIAGNOSIS — K760 Fatty (change of) liver, not elsewhere classified: Secondary | ICD-10-CM | POA: Diagnosis not present

## 2019-01-05 ENCOUNTER — Other Ambulatory Visit: Payer: Self-pay | Admitting: Nurse Practitioner

## 2019-01-05 DIAGNOSIS — K760 Fatty (change of) liver, not elsewhere classified: Secondary | ICD-10-CM

## 2019-01-10 ENCOUNTER — Ambulatory Visit
Admission: RE | Admit: 2019-01-10 | Discharge: 2019-01-10 | Disposition: A | Discharge status: Home / Self Care | Payer: 59 | Source: Ambulatory Visit | Attending: Obstetrics and Gynecology | Admitting: Obstetrics and Gynecology

## 2019-01-10 DIAGNOSIS — Z1231 Encounter for screening mammogram for malignant neoplasm of breast: Secondary | ICD-10-CM

## 2019-01-12 ENCOUNTER — Other Ambulatory Visit: Payer: Self-pay | Admitting: Obstetrics and Gynecology

## 2019-01-12 DIAGNOSIS — R928 Other abnormal and inconclusive findings on diagnostic imaging of breast: Secondary | ICD-10-CM

## 2019-01-17 ENCOUNTER — Ambulatory Visit
Admission: RE | Admit: 2019-01-17 | Discharge: 2019-01-17 | Disposition: A | Discharge status: Home / Self Care | Payer: 59 | Source: Ambulatory Visit | Attending: Nurse Practitioner | Admitting: Nurse Practitioner

## 2019-01-17 DIAGNOSIS — K760 Fatty (change of) liver, not elsewhere classified: Secondary | ICD-10-CM

## 2019-01-18 ENCOUNTER — Other Ambulatory Visit: Payer: Self-pay | Admitting: Obstetrics and Gynecology

## 2019-01-18 ENCOUNTER — Ambulatory Visit
Admission: RE | Admit: 2019-01-18 | Discharge: 2019-01-18 | Disposition: A | Discharge status: Home / Self Care | Payer: 59 | Source: Ambulatory Visit | Attending: Obstetrics and Gynecology | Admitting: Obstetrics and Gynecology

## 2019-01-18 DIAGNOSIS — N6011 Diffuse cystic mastopathy of right breast: Secondary | ICD-10-CM | POA: Diagnosis not present

## 2019-01-18 DIAGNOSIS — R928 Other abnormal and inconclusive findings on diagnostic imaging of breast: Secondary | ICD-10-CM

## 2019-01-18 DIAGNOSIS — N6489 Other specified disorders of breast: Secondary | ICD-10-CM

## 2019-02-15 DIAGNOSIS — G43709 Chronic migraine without aura, not intractable, without status migrainosus: Secondary | ICD-10-CM | POA: Diagnosis not present

## 2019-02-15 DIAGNOSIS — K760 Fatty (change of) liver, not elsewhere classified: Secondary | ICD-10-CM | POA: Diagnosis not present

## 2019-02-15 DIAGNOSIS — E663 Overweight: Secondary | ICD-10-CM | POA: Diagnosis not present

## 2019-03-21 ENCOUNTER — Ambulatory Visit: Payer: 59 | Admitting: Obstetrics and Gynecology

## 2019-03-22 ENCOUNTER — Ambulatory Visit: Payer: 59 | Admitting: Obstetrics and Gynecology

## 2019-05-01 ENCOUNTER — Other Ambulatory Visit: Payer: Self-pay

## 2019-05-03 ENCOUNTER — Ambulatory Visit (INDEPENDENT_AMBULATORY_CARE_PROVIDER_SITE_OTHER): Payer: 59 | Admitting: Obstetrics and Gynecology

## 2019-05-03 ENCOUNTER — Encounter: Payer: Self-pay | Admitting: Obstetrics and Gynecology

## 2019-05-03 ENCOUNTER — Other Ambulatory Visit: Payer: Self-pay

## 2019-05-03 ENCOUNTER — Other Ambulatory Visit (HOSPITAL_COMMUNITY)
Admission: RE | Admit: 2019-05-03 | Discharge: 2019-05-03 | Disposition: A | Discharge status: Home / Self Care | Payer: 59 | Source: Ambulatory Visit | Attending: Obstetrics and Gynecology | Admitting: Obstetrics and Gynecology

## 2019-05-03 VITALS — BP 112/80 | HR 76 | Temp 97.9°F | Ht 67.0 in | Wt 176.0 lb

## 2019-05-03 DIAGNOSIS — Z9189 Other specified personal risk factors, not elsewhere classified: Secondary | ICD-10-CM | POA: Diagnosis not present

## 2019-05-03 DIAGNOSIS — Z01419 Encounter for gynecological examination (general) (routine) without abnormal findings: Secondary | ICD-10-CM

## 2019-05-03 DIAGNOSIS — Z124 Encounter for screening for malignant neoplasm of cervix: Secondary | ICD-10-CM | POA: Insufficient documentation

## 2019-05-03 MED ORDER — SUMATRIPTAN SUCCINATE 50 MG PO TABS: ORAL_TABLET | ORAL | 2 refills | Status: AC

## 2019-05-03 NOTE — Progress Notes (Signed)
53 y.o. G48P2002 Married White or Caucasian Not Hispanic or Latino female here for annual exam.   No vaginal bleeding. Sexually active, mildly uncomfortable, helped with lubricant.      Patient's last menstrual period was 12/24/2007.          Sexually active: Yes.    The current method of family planning is spouse with a vasectomy.    Exercising: Yes.    walking, pure barre Smoker:  no  Health Maintenance: Pap:  02-23-16 WNL NEG HR HPV 12-09-11 WNL NEG HR HPV  History of abnormal Pap:  Yes 25 years ago MMG:  01/10/2019 Birads 0, 01/18/2019 right diagnostic with u/s Birads 3 probably benign f/u in 6 months, 05/19/18 Breast MRI WNL  Colonoscopy:  2016 WNL per patient  BMD:   07-10-13 Normal TDaP:  02-28-17 Gardasil: N/A   reports that she has never smoked. She has never used smokeless tobacco. She reports that she does not drink alcohol or use drugs. No ETOH. Works at [employer] for [employer]. Mother is now local, living independently. Daughter is in [place] (will transfer to [place]), Son is at home, graduating from college in 11/20. Taking his MCAT  Past Medical History:  Diagnosis Date  . Abnormal Pap smear of cervix   . At risk for breast cancer 03/04/2014   High: 20% or greater/Tyrer-Cuzick Model   . Fibroid   . Migraines   . NAFLD (nonalcoholic fatty liver disease)   . Squamous cell skin cancer     Past Surgical History:  Procedure Laterality Date  . AUGMENTATION MAMMAPLASTY Bilateral 1992  . PLANTAR FASCIA SURGERY      Current Outpatient Medications  Medication Sig Dispense Refill  . calcium-vitamin D (OSCAL WITH D) 500-200 MG-UNIT tablet Take 1 tablet by mouth.    . Multiple Vitamins-Minerals (MULTIVITAMIN PO) Take by mouth daily.    . SUMAtriptan (IMITREX) 50 MG tablet Take 1 tablet po prn migraine, may repeat x 1 in 2 hours prn 10 tablet 2   No current facility-administered medications for this visit.     Family History  Problem Relation Age of Onset  . Breast  cancer Mother 56       stage 2  . Alcohol abuse Mother   . Mental illness Mother   . Mental illness Maternal Grandmother   . Lupus Maternal Grandfather     Review of Systems  Constitutional: Negative.   HENT: Negative.   Eyes: Negative.   Respiratory: Negative.   Cardiovascular: Negative.   Gastrointestinal: Negative.   Endocrine: Negative.   Genitourinary: Negative.   Musculoskeletal: Negative.   Skin: Negative.   Allergic/Immunologic: Negative.   Neurological: Negative.   Hematological: Negative.   Psychiatric/Behavioral: Negative.     Exam:   BP 112/80 (BP Location: Right Arm, Patient Position: Sitting, Cuff Size: Normal)   Pulse 76   Temp 97.9 F (36.6 C) (Skin)   Ht 5\' 7"  (1.702 m)   Wt 176 lb (79.8 kg)   LMP 12/24/2007   BMI 27.57 kg/m   Weight change: @WEIGHTCHANGE @ Height:   Height: 5\' 7"  (170.2 cm)  Ht Readings from Last 3 Encounters:  05/03/19 5\' 7"  (1.702 m)  03/16/18 5\' 7"  (1.702 m)  05/13/17 5\' 7"  (1.702 m)    General appearance: alert, cooperative and appears stated age Head: Normocephalic, without obvious abnormality, atraumatic Neck: no adenopathy, supple, symmetrical, trachea midline and thyroid normal to inspection and palpation Lungs: clear to auscultation bilaterally Cardiovascular: regular rate and rhythm  Breasts: normal appearance, no masses or tenderness Abdomen: soft, non-tender; non distended,  no masses,  no organomegaly Extremities: extremities normal, atraumatic, no cyanosis or edema Skin: Skin color, texture, turgor normal. No rashes or lesions Lymph nodes: Cervical, supraclavicular, and axillary nodes normal. No abnormal inguinal nodes palpated Neurologic: Grossly normal   Pelvic: External genitalia:  no lesions              Urethra:  normal appearing urethra with no masses, tenderness or lesions              Bartholins and Skenes: normal                 Vagina: mildly atrophic appearing vagina with normal color and discharge, no  lesions              Cervix: no lesions               Bimanual Exam:  Uterus:  normal size, contour, position, consistency, mobility, non-tender              Adnexa: no mass, fullness, tenderness               Rectovaginal: Confirms               Anus:  normal sphincter tone, no lesions  Chaperone was present for exam.  A:  Well Woman with normal exam  High risk for breast cancer  P:   Pap with hpv  Colonoscopy is UTD  Mammogram f/u in 8/20  Will schedule breast MRI  Discussed breast self exam  Discussed calcium and vit D intake  Labs UTD (has done with hepatologist)

## 2019-05-03 NOTE — Patient Instructions (Signed)

## 2019-05-04 ENCOUNTER — Telehealth: Payer: Self-pay | Admitting: *Deleted

## 2019-05-04 DIAGNOSIS — Z9189 Other specified personal risk factors, not elsewhere classified: Secondary | ICD-10-CM

## 2019-05-04 DIAGNOSIS — Z1231 Encounter for screening mammogram for malignant neoplasm of breast: Secondary | ICD-10-CM

## 2019-05-04 DIAGNOSIS — Z803 Family history of malignant neoplasm of breast: Secondary | ICD-10-CM

## 2019-05-04 NOTE — Telephone Encounter (Signed)
Per review of previous breast MRI, IV sedation needed. Reviewed with Dr. Talbert Nan.   Written order for bilateral breast MRI w/wo contrast and IV sedation to Dr. Talbert Nan for signature. Dx: Increase risk of breast cancer and family Hx breast cancer in mother.

## 2019-05-04 NOTE — Telephone Encounter (Signed)
From: Salvadore Dom, MD  Sent: 05/03/2019  2:20 PM EDT  To: Burnice Logan, RN   Please set this patient up for her breast MRI at [facility] in [facility]. High risk breast cancer  [phone]  Thanks,  Sharee Pimple

## 2019-05-07 NOTE — Telephone Encounter (Signed)
Written order faxed to [facility] at [phone]. Copy of order to scan.   Order entered in [place] for external facility for precert.    Call to patient. Left detailed message, ok per dpr. Advised order has been faxed to [facility] for bilateral breast MRI w/wo contrast and IV sedation, they will contact you directly to schedule. Requested patient return call to office or send MyChart message to notify office when scheduled so that our office can precert MRI. Return call to office if any additional questions.    Routing to [employer].  Encounter closed.

## 2019-05-08 LAB — CYTOLOGY - PAP
Diagnosis: NEGATIVE
HPV: NOT DETECTED

## 2019-05-11 ENCOUNTER — Telehealth: Payer: Self-pay | Admitting: Obstetrics and Gynecology

## 2019-05-11 NOTE — Telephone Encounter (Signed)
Routing to [employer] for precertification. To be done at [facility].

## 2019-05-11 NOTE — Telephone Encounter (Signed)
Patient called to let nurse know she has MRI scheduled for Wed 6/24 at 1:30.

## 2019-05-14 ENCOUNTER — Telehealth: Payer: Self-pay | Admitting: Obstetrics and Gynecology

## 2019-05-14 NOTE — Telephone Encounter (Signed)
Patient was referred to [facility] and Anderson Malta is requesting last ov note and 3 yrs of mammograms. Phone is [phone] and fax [phone] attn Anderson Malta.

## 2019-05-14 NOTE — Telephone Encounter (Signed)
Spoke with Anderson Malta at [facility]. Patient is scheduled for Bil.breast MRI  on 05-16-19. They need copy of last 3 mammograms and last office note. Faxed requested information to Jennifer's attention. Routed to Dr.Jertson to sign and close encounter.  [facility] @ [employer]. [phone]                                       [phone]

## 2019-05-14 NOTE — Telephone Encounter (Signed)
Spoke with Baker Janus with [facility] at [phone]. She confirmed patient is scheduled for a breast MRI on 05/16/2019 at 1:50 PM. I confirmed with Baker Janus, based on verification call to [facility], representative Nicole Kindred (call reference number is [phone] 05/09/2019 @ 3:31PM EST) CPT code [phone] does not require prior approval. Baker Janus advised they will contact our office if anything additional will be required.   Forwarding to Dr Talbert Nan for final review. Will close encounter  cc: Glorianne Manchester, RN

## 2019-05-21 ENCOUNTER — Telehealth: Payer: Self-pay | Admitting: Obstetrics and Gynecology

## 2019-05-21 NOTE — Telephone Encounter (Signed)
Removed from imaging hold.

## 2019-05-21 NOTE — Telephone Encounter (Signed)
Please let the patient know that her breast MRI was normal! Needs f/u MRI in one year.

## 2019-05-21 NOTE — Telephone Encounter (Signed)
Spoke with patient. Results given. Patient verbalizes understanding. 04 recall placed. Routing to Glorianne Manchester, RN to remove from imaging hold as I am unable to access holds at this time. Encounter closed.

## 2019-06-28 ENCOUNTER — Encounter: Payer: Self-pay | Admitting: Obstetrics and Gynecology

## 2019-06-28 ENCOUNTER — Ambulatory Visit (INDEPENDENT_AMBULATORY_CARE_PROVIDER_SITE_OTHER): Payer: BC Managed Care – PPO | Admitting: Obstetrics and Gynecology

## 2019-06-28 ENCOUNTER — Telehealth: Payer: Self-pay | Admitting: Obstetrics and Gynecology

## 2019-06-28 ENCOUNTER — Other Ambulatory Visit: Payer: Self-pay

## 2019-06-28 VITALS — BP 106/72 | HR 64 | Temp 98.1°F | Wt 179.6 lb

## 2019-06-28 DIAGNOSIS — N611 Abscess of the breast and nipple: Secondary | ICD-10-CM | POA: Diagnosis not present

## 2019-06-28 MED ORDER — SULFAMETHOXAZOLE-TRIMETHOPRIM 800-160 MG PO TABS: 1.0000 | ORAL_TABLET | Freq: Two times a day (BID) | ORAL | 0 refills | Status: DC

## 2019-06-28 NOTE — Telephone Encounter (Signed)
Spoke with patient. Reports dime to quarter size lump in right breast near the nipple. Tender to touch, warm and red. Noticed 8/5, area of redness has gotten larger. Denies fever/chills. Patient is in [place], traveling back this afternoon. OV scheduled for 8/6 at 4:15pm with Dr. Talbert Nan.   Patient verbalizes understanding and is agreeable.   Encounter closed.

## 2019-06-28 NOTE — Progress Notes (Signed)
GYNECOLOGY  VISIT   HPI: 53 y.o.   Married White or Caucasian Not Hispanic or Latino  female   [phone] with Patient's last menstrual period was 12/24/2007.   here for right breast lump that is red and warm to the touch. She noticed it last night, last night it was tender, today it is red and warm. No fever, no systemic symptoms.   GYNECOLOGIC HISTORY: Patient's last menstrual period was 12/24/2007. Contraception: Postmenopausal Menopausal hormone therapy: None        OB History    Gravida  2   Para  2   Term  2   Preterm      AB      Living  2     SAB      TAB      Ectopic      Multiple      Live Births                 Patient Active Problem List   Diagnosis Date Noted  . NAFL (nonalcoholic fatty liver) 93/81/8299    Past Medical History:  Diagnosis Date  . Abnormal Pap smear of cervix   . At risk for breast cancer 03/04/2014   High: 20% or greater/Tyrer-Cuzick Model   . Fibroid   . Migraines   . NAFLD (nonalcoholic fatty liver disease)   . Squamous cell skin cancer     Past Surgical History:  Procedure Laterality Date  . AUGMENTATION MAMMAPLASTY Bilateral 1992  . PLANTAR FASCIA SURGERY      Current Outpatient Medications  Medication Sig Dispense Refill  . calcium-vitamin D (OSCAL WITH D) 500-200 MG-UNIT tablet Take 1 tablet by mouth.    . Multiple Vitamins-Minerals (MULTIVITAMIN PO) Take by mouth daily.    . SUMAtriptan (IMITREX) 50 MG tablet Take 1 tablet po prn migraine, may repeat x 1 in 2 hours prn 10 tablet 2   No current facility-administered medications for this visit.      ALLERGIES: Phenergan [promethazine hcl]  Family History  Problem Relation Age of Onset  . Breast cancer Mother 91       stage 2  . Alcohol abuse Mother   . Mental illness Mother   . Mental illness Maternal Grandmother   . Lupus Maternal Grandfather     Social History   Socioeconomic History  . Marital status: Married    Spouse name: Not on file  .  Number of children: Not on file  . Years of education: Not on file  . Highest education level: Not on file  Occupational History  . Not on file  Social Needs  . Financial resource strain: Not on file  . Food insecurity    Worry: Not on file    Inability: Not on file  . Transportation needs    Medical: Not on file    Non-medical: Not on file  Tobacco Use  . Smoking status: Never Smoker  . Smokeless tobacco: Never Used  Substance and Sexual Activity  . Alcohol use: Never    Frequency: Never  . Drug use: No  . Sexual activity: Yes    Partners: Male    Birth control/protection: Post-menopausal    Comment: husband vasectomy  Lifestyle  . Physical activity    Days per week: Not on file    Minutes per session: Not on file  . Stress: Not on file  Relationships  . Social connections    Talks on phone: Not on file  Gets together: Not on file    Attends religious service: Not on file    Active member of club or organization: Not on file    Attends meetings of clubs or organizations: Not on file    Relationship status: Not on file  . Intimate partner violence    Fear of current or ex partner: Not on file    Emotionally abused: Not on file    Physically abused: Not on file    Forced sexual activity: Not on file  Other Topics Concern  . Not on file  Social History Narrative  . Not on file    Review of Systems  Constitutional:       Right breast lump  HENT: Negative.   Eyes: Negative.   Respiratory: Negative.   Cardiovascular: Negative.   Gastrointestinal: Negative.   Genitourinary: Negative.   Musculoskeletal: Negative.   Skin: Negative.   Neurological: Negative.   Endo/Heme/Allergies: Negative.   Psychiatric/Behavioral: Negative.     PHYSICAL EXAMINATION:    BP 106/72 (BP Location: Right Arm, Patient Position: Sitting, Cuff Size: Large)   Pulse 64   Temp 98.1 F (36.7 C) (Skin)   Wt 179 lb 9.6 oz (81.5 kg)   LMP 12/24/2007   BMI 28.13 kg/m     General  appearance: alert, cooperative and appears stated age Breasts: in the right areolar region at 6 o'clock is a 2 x 1 cm tender lump wtih surrounding erythema. No other breast lumps. No supraclavicular or axillary adenopathy.  ASSESSMENT Right breast abscess. Recent normal breast MRI    PLAN Start bactrim DS, 1 po BID x 10 days Send for imaging and possible drainage.  Ibuprofen for pain   An After Visit Summary was printed and given to the patient.

## 2019-06-28 NOTE — Telephone Encounter (Signed)
Patient is calling regarding a lump that she found on her right breast. Patient stated that it is near her nipple. Patient stated that it is sore, tender, and warm to the touch. Patient stated that she is out of town today and would not be able to come in until tomorrow.

## 2019-06-29 ENCOUNTER — Other Ambulatory Visit: Payer: Self-pay | Admitting: Obstetrics and Gynecology

## 2019-06-29 ENCOUNTER — Ambulatory Visit
Admission: RE | Admit: 2019-06-29 | Discharge: 2019-06-29 | Disposition: A | Discharge status: Home / Self Care | Payer: PRIVATE HEALTH INSURANCE | Source: Ambulatory Visit | Attending: Obstetrics and Gynecology | Admitting: Obstetrics and Gynecology

## 2019-06-29 ENCOUNTER — Ambulatory Visit: Payer: 59

## 2019-06-29 ENCOUNTER — Ambulatory Visit
Admission: RE | Admit: 2019-06-29 | Discharge: 2019-06-29 | Disposition: A | Discharge status: Home / Self Care | Payer: 59 | Source: Ambulatory Visit | Attending: Obstetrics and Gynecology | Admitting: Obstetrics and Gynecology

## 2019-06-29 ENCOUNTER — Telehealth: Payer: Self-pay | Admitting: *Deleted

## 2019-06-29 DIAGNOSIS — R928 Other abnormal and inconclusive findings on diagnostic imaging of breast: Secondary | ICD-10-CM | POA: Diagnosis not present

## 2019-06-29 DIAGNOSIS — N611 Abscess of the breast and nipple: Secondary | ICD-10-CM

## 2019-06-29 DIAGNOSIS — N6489 Other specified disorders of breast: Secondary | ICD-10-CM

## 2019-06-29 DIAGNOSIS — N6310 Unspecified lump in the right breast, unspecified quadrant: Secondary | ICD-10-CM

## 2019-06-29 DIAGNOSIS — N6001 Solitary cyst of right breast: Secondary | ICD-10-CM | POA: Diagnosis not present

## 2019-06-29 NOTE — Telephone Encounter (Signed)
Call placed to [facility], spoke with Benjamine Mola. Orders placed for right breast Dx MMG, Ultrasound and aspiration, if needed. Benjamine Mola is awaiting return call from nurse at [facility] for work in appt today. Benjamine Mola will return call to office and patient to notify of appt.

## 2019-06-29 NOTE — Telephone Encounter (Signed)
-----   Message from Salvadore Dom, MD sent at 06/28/2019  5:00 PM EDT ----- Right breast abscess, needs imaging and possible drainage.

## 2019-06-29 NOTE — Telephone Encounter (Signed)
Spoke with patient. Patient has been notified of appt at [facility] today. Patient thankful for f/u.   Routing to provider for final review. Patient is agreeable to disposition. Will close encounter.

## 2019-06-29 NOTE — Telephone Encounter (Signed)
Left message to call Sharee Pimple, RN at [place].    Per review of Epic, patient is scheduled for Dx MMG, [place] and aspiration of right breast, if needed, today at 1pm.

## 2019-07-03 NOTE — Progress Notes (Signed)
GYNECOLOGY  VISIT   HPI: 53 y.o.   Married White or Caucasian Not Hispanic or Latino  female   [phone] with Patient's last menstrual period was 12/24/2007.   here for f/u of right breast infection. She was seen on 06/28/19 with a tender, erythematous right breast lump, c/w breast abscess. She was started on antibiotics. Imaging the next day didn't show an abscess, but was c/w mastitis (the patient reported improvement with the 24 hours of antibiotics).  She is feeling 90% better, the lump is better, occasionally tender if she moves in certain positions. She was given 10 days of Bactrim and only has a few tablets left.   GYNECOLOGIC HISTORY: Patient's last menstrual period was 12/24/2007. Contraception: PMP Menopausal hormone therapy: none        OB History    Gravida  2   Para  2   Term  2   Preterm      AB      Living  2     SAB      TAB      Ectopic      Multiple      Live Births                 Patient Active Problem List   Diagnosis Date Noted  . NAFL (nonalcoholic fatty liver) 65/46/5035    Past Medical History:  Diagnosis Date  . Abnormal Pap smear of cervix   . At risk for breast cancer 03/04/2014   High: 20% or greater/Tyrer-Cuzick Model   . Fibroid   . Migraines   . NAFLD (nonalcoholic fatty liver disease)   . Squamous cell skin cancer     Past Surgical History:  Procedure Laterality Date  . AUGMENTATION MAMMAPLASTY Bilateral 1992  . PLANTAR FASCIA SURGERY      Current Outpatient Medications  Medication Sig Dispense Refill  . calcium-vitamin D (OSCAL WITH D) 500-200 MG-UNIT tablet Take 1 tablet by mouth.    . cholecalciferol (VITAMIN D3) 25 MCG (1000 UT) tablet Take 1,000 Units by mouth daily.    . Multiple Vitamins-Minerals (MULTIVITAMIN PO) Take by mouth daily.    Marland Kitchen sulfamethoxazole-trimethoprim (BACTRIM DS) 800-160 MG tablet Take 1 tablet by mouth 2 (two) times daily. One PO BID x 10 days 20 tablet 0  . SUMAtriptan (IMITREX) 50 MG tablet  Take 1 tablet po prn migraine, may repeat x 1 in 2 hours prn 10 tablet 2   No current facility-administered medications for this visit.      ALLERGIES: Phenergan [promethazine hcl]  Family History  Problem Relation Age of Onset  . Breast cancer Mother 73       stage 2  . Alcohol abuse Mother   . Mental illness Mother   . Mental illness Maternal Grandmother   . Lupus Maternal Grandfather     Social History   Socioeconomic History  . Marital status: Married    Spouse name: Not on file  . Number of children: Not on file  . Years of education: Not on file  . Highest education level: Not on file  Occupational History  . Not on file  Social Needs  . Financial resource strain: Not on file  . Food insecurity    Worry: Not on file    Inability: Not on file  . Transportation needs    Medical: Not on file    Non-medical: Not on file  Tobacco Use  . Smoking status: Never Smoker  . Smokeless  tobacco: Never Used  Substance and Sexual Activity  . Alcohol use: Never    Frequency: Never  . Drug use: No  . Sexual activity: Yes    Partners: Male    Birth control/protection: Post-menopausal    Comment: husband vasectomy  Lifestyle  . Physical activity    Days per week: Not on file    Minutes per session: Not on file  . Stress: Not on file  Relationships  . Social Herbalist on phone: Not on file    Gets together: Not on file    Attends religious service: Not on file    Active member of club or organization: Not on file    Attends meetings of clubs or organizations: Not on file    Relationship status: Not on file  . Intimate partner violence    Fear of current or ex partner: Not on file    Emotionally abused: Not on file    Physically abused: Not on file    Forced sexual activity: Not on file  Other Topics Concern  . Not on file  Social History Narrative  . Not on file    Review of Systems  Constitutional: Negative.   HENT: Negative.   Eyes: Negative.    Respiratory: Negative.   Cardiovascular: Negative.   Gastrointestinal: Negative.   Genitourinary: Negative.   Musculoskeletal: Negative.   Skin: Negative.   Neurological: Negative.   Endo/Heme/Allergies: Negative.   Psychiatric/Behavioral: Negative.   All other systems reviewed and are negative.   PHYSICAL EXAMINATION:    BP 102/60   Pulse 80   Temp 97.9 F (36.6 C) (Temporal)   Ht 5\' 7"  (1.702 m)   Wt 177 lb (80.3 kg)   LMP 12/24/2007   BMI 27.72 kg/m     General appearance: alert, cooperative and appears stated age  Breasts: the right breast erythema has resolved, she still has a prominant ridge of tender tissue on the infertior portion of the areolar region. No other lumps, no skin changes.   No axillary adenopathy  ASSESSMENT Right breast mastitis, imaging c/w mastitis (also stable complicated cyst noted). She is 90% better, still with a prominent tender ridge of tissue.     PLAN Will continue antibiotics for a full 14 days, call if not completely better at the end of the antibiotics Will return for a f/u breast check in 3 months F/U breast imaging in 2/21   An After Visit Summary was printed and given to the patient.

## 2019-07-05 ENCOUNTER — Other Ambulatory Visit: Payer: Self-pay

## 2019-07-05 ENCOUNTER — Encounter: Payer: Self-pay | Admitting: Obstetrics and Gynecology

## 2019-07-05 ENCOUNTER — Ambulatory Visit (INDEPENDENT_AMBULATORY_CARE_PROVIDER_SITE_OTHER): Payer: BC Managed Care – PPO | Admitting: Obstetrics and Gynecology

## 2019-07-05 VITALS — BP 102/60 | HR 80 | Temp 97.9°F | Ht 67.0 in | Wt 177.0 lb

## 2019-07-05 DIAGNOSIS — N61 Mastitis without abscess: Secondary | ICD-10-CM | POA: Diagnosis not present

## 2019-07-05 MED ORDER — SULFAMETHOXAZOLE-TRIMETHOPRIM 800-160 MG PO TABS: 1.0000 | ORAL_TABLET | Freq: Two times a day (BID) | ORAL | 0 refills | Status: DC

## 2019-07-06 ENCOUNTER — Telehealth: Payer: Self-pay | Admitting: Obstetrics and Gynecology

## 2019-07-06 NOTE — Telephone Encounter (Signed)
Left message for patient to call and schedule a 3 months (around 10/05/2019) for f/u breast check.

## 2019-07-19 ENCOUNTER — Other Ambulatory Visit: Payer: 59

## 2019-08-16 DIAGNOSIS — Z0000 Encounter for general adult medical examination without abnormal findings: Secondary | ICD-10-CM | POA: Diagnosis not present

## 2019-08-16 DIAGNOSIS — K760 Fatty (change of) liver, not elsewhere classified: Secondary | ICD-10-CM | POA: Diagnosis not present

## 2019-08-16 DIAGNOSIS — G43709 Chronic migraine without aura, not intractable, without status migrainosus: Secondary | ICD-10-CM | POA: Diagnosis not present

## 2019-08-21 DIAGNOSIS — R82998 Other abnormal findings in urine: Secondary | ICD-10-CM | POA: Diagnosis not present

## 2019-08-22 DIAGNOSIS — Z0000 Encounter for general adult medical examination without abnormal findings: Secondary | ICD-10-CM | POA: Diagnosis not present

## 2019-08-30 DIAGNOSIS — R509 Fever, unspecified: Secondary | ICD-10-CM | POA: Diagnosis not present

## 2019-08-30 DIAGNOSIS — R197 Diarrhea, unspecified: Secondary | ICD-10-CM | POA: Diagnosis not present

## 2019-08-30 DIAGNOSIS — R112 Nausea with vomiting, unspecified: Secondary | ICD-10-CM | POA: Diagnosis not present

## 2019-08-30 DIAGNOSIS — Z20818 Contact with and (suspected) exposure to other bacterial communicable diseases: Secondary | ICD-10-CM | POA: Diagnosis not present

## 2019-09-05 DIAGNOSIS — G43709 Chronic migraine without aura, not intractable, without status migrainosus: Secondary | ICD-10-CM | POA: Diagnosis not present

## 2019-10-03 ENCOUNTER — Ambulatory Visit: Payer: BC Managed Care – PPO | Admitting: Obstetrics and Gynecology

## 2019-10-04 ENCOUNTER — Ambulatory Visit: Payer: BC Managed Care – PPO | Admitting: Obstetrics and Gynecology

## 2019-10-09 ENCOUNTER — Other Ambulatory Visit: Payer: Self-pay

## 2019-10-10 ENCOUNTER — Encounter: Payer: Self-pay | Admitting: Obstetrics and Gynecology

## 2019-10-10 ENCOUNTER — Ambulatory Visit (INDEPENDENT_AMBULATORY_CARE_PROVIDER_SITE_OTHER): Payer: BC Managed Care – PPO | Admitting: Obstetrics and Gynecology

## 2019-10-10 VITALS — BP 112/72 | HR 72 | Temp 97.9°F | Wt 180.8 lb

## 2019-10-10 DIAGNOSIS — Z87898 Personal history of other specified conditions: Secondary | ICD-10-CM

## 2019-10-10 DIAGNOSIS — Z0000 Encounter for general adult medical examination without abnormal findings: Secondary | ICD-10-CM

## 2019-10-10 NOTE — Progress Notes (Signed)
GYNECOLOGY  VISIT   HPI: 52 y.o.   Married White or Caucasian Not Hispanic or Latino  female   [phone] with Patient's last menstrual period was 12/24/2007.   here for breast recheck. She was treated for a right breast mastitis in 8/20. She had a prominent ridge of breast tissue at her last exam and is here for a repeat breast exam.  It took a long time to resolve, but she feels fine now.   She recently started on Aimovig for her migraines.  GYNECOLOGIC HISTORY: Patient's last menstrual period was 12/24/2007. Contraception: Postmenopausal Menopausal hormone therapy: None        OB History    Gravida  2   Para  2   Term  2   Preterm      AB      Living  2     SAB      TAB      Ectopic      Multiple      Live Births                 Patient Active Problem List   Diagnosis Date Noted  . NAFL (nonalcoholic fatty liver) 123456    Past Medical History:  Diagnosis Date  . Abnormal Pap smear of cervix   . At risk for breast cancer 03/04/2014   High: 20% or greater/Tyrer-Cuzick Model   . Fibroid   . Migraines   . NAFLD (nonalcoholic fatty liver disease)   . Squamous cell skin cancer     Past Surgical History:  Procedure Laterality Date  . AUGMENTATION MAMMAPLASTY Bilateral 1992  . PLANTAR FASCIA SURGERY      Current Outpatient Medications  Medication Sig Dispense Refill  . calcium-vitamin D (OSCAL WITH D) 500-200 MG-UNIT tablet Take 1 tablet by mouth.    . cholecalciferol (VITAMIN D3) 25 MCG (1000 UT) tablet Take 1,000 Units by mouth daily.    . Multiple Vitamins-Minerals (MULTIVITAMIN PO) Take by mouth daily.    . SUMAtriptan (IMITREX) 50 MG tablet Take 1 tablet po prn migraine, may repeat x 1 in 2 hours prn 10 tablet 2  . AIMOVIG 140 MG/ML SOAJ Inject 1 mL into the skin every 30 (thirty) days.     No current facility-administered medications for this visit.      ALLERGIES: Phenergan [promethazine hcl]  Family History  Problem Relation Age of  Onset  . Breast cancer Mother 58       stage 2  . Alcohol abuse Mother   . Mental illness Mother   . Mental illness Maternal Grandmother   . Lupus Maternal Grandfather     Social History   Socioeconomic History  . Marital status: Married    Spouse name: Not on file  . Number of children: Not on file  . Years of education: Not on file  . Highest education level: Not on file  Occupational History  . Not on file  Social Needs  . Financial resource strain: Not on file  . Food insecurity    Worry: Not on file    Inability: Not on file  . Transportation needs    Medical: Not on file    Non-medical: Not on file  Tobacco Use  . Smoking status: Never Smoker  . Smokeless tobacco: Never Used  Substance and Sexual Activity  . Alcohol use: Never    Frequency: Never  . Drug use: No  . Sexual activity: Yes    Partners: Male  Birth control/protection: Post-menopausal    Comment: husband vasectomy  Lifestyle  . Physical activity    Days per week: Not on file    Minutes per session: Not on file  . Stress: Not on file  Relationships  . Social Herbalist on phone: Not on file    Gets together: Not on file    Attends religious service: Not on file    Active member of club or organization: Not on file    Attends meetings of clubs or organizations: Not on file    Relationship status: Not on file  . Intimate partner violence    Fear of current or ex partner: Not on file    Emotionally abused: Not on file    Physically abused: Not on file    Forced sexual activity: Not on file  Other Topics Concern  . Not on file  Social History Narrative  . Not on file    Review of Systems  Constitutional: Negative.   HENT: Negative.   Eyes: Negative.   Respiratory: Negative.   Cardiovascular: Negative.   Gastrointestinal: Negative.   Genitourinary: Negative.   Musculoskeletal: Negative.   Skin: Negative.   Neurological: Negative.   Endo/Heme/Allergies: Negative.    Psychiatric/Behavioral: Negative.     PHYSICAL EXAMINATION:    BP 112/72 (BP Location: Right Arm, Patient Position: Sitting, Cuff Size: Normal)   Pulse 72   Temp 97.9 F (36.6 C) (Skin)   Wt 180 lb 12.8 oz (82 kg)   LMP 12/24/2007   BMI 28.32 kg/m     General appearance: alert, cooperative and appears stated age Breasts: bilateral implants, no lumps, no skin changes, still with mild tendeness under the right areolar region.  No axillary or supraclavicular adenopathy  ASSESSMENT H/O mastitis, nodularity has resolved Elevated risk of breast cancer    PLAN Next breast imaging in 2/21 Yearly breast MRI F/U for annual exam in 6/21   An After Visit Summary was printed and given to the patient.

## 2019-11-22 ENCOUNTER — Encounter

## 2020-01-11 ENCOUNTER — Ambulatory Visit: Payer: 59 | Attending: Internal Medicine

## 2020-01-11 DIAGNOSIS — Z23 Encounter for immunization: Secondary | ICD-10-CM | POA: Insufficient documentation

## 2020-01-11 NOTE — Progress Notes (Signed)
   Covid-19 Vaccination Clinic  Name:  Tricia Barron    MRN: [id number] DOB: [birth year]  01/11/2020  Tricia Barron was observed post Covid-19 immunization for 15 minutes without incidence. She was provided with Vaccine Information Sheet and instruction to access the V-Safe system.   Tricia Barron was instructed to call 911 with any severe reactions post vaccine: Marland Kitchen Difficulty breathing  . Swelling of your face and throat  . A fast heartbeat  . A bad rash all over your body  . Dizziness and weakness    Immunizations Administered    Name Date Dose VIS Date Route   Pfizer COVID-19 Vaccine 01/11/2020 12:45 PM 0.3 mL 11/02/2019 Intramuscular   Manufacturer: [place]   Lot: [id number]   [place]: [id number]

## 2020-01-16 ENCOUNTER — Other Ambulatory Visit: Payer: Self-pay

## 2020-01-16 ENCOUNTER — Ambulatory Visit
Admission: RE | Admit: 2020-01-16 | Discharge: 2020-01-16 | Disposition: A | Discharge status: Home / Self Care | Payer: PRIVATE HEALTH INSURANCE | Source: Ambulatory Visit | Attending: Obstetrics and Gynecology | Admitting: Obstetrics and Gynecology

## 2020-01-16 ENCOUNTER — Ambulatory Visit
Admission: RE | Admit: 2020-01-16 | Discharge: 2020-01-16 | Disposition: A | Discharge status: Home / Self Care | Payer: BC Managed Care – PPO | Source: Ambulatory Visit | Attending: Obstetrics and Gynecology | Admitting: Obstetrics and Gynecology

## 2020-01-16 DIAGNOSIS — N6310 Unspecified lump in the right breast, unspecified quadrant: Secondary | ICD-10-CM

## 2020-01-16 DIAGNOSIS — N611 Abscess of the breast and nipple: Secondary | ICD-10-CM

## 2020-01-16 DIAGNOSIS — R922 Inconclusive mammogram: Secondary | ICD-10-CM | POA: Diagnosis not present

## 2020-01-16 DIAGNOSIS — N6489 Other specified disorders of breast: Secondary | ICD-10-CM | POA: Diagnosis not present

## 2020-01-21 DIAGNOSIS — L820 Inflamed seborrheic keratosis: Secondary | ICD-10-CM | POA: Diagnosis not present

## 2020-02-01 ENCOUNTER — Telehealth: Payer: Self-pay | Admitting: Obstetrics and Gynecology

## 2020-02-01 ENCOUNTER — Encounter: Payer: Self-pay | Admitting: Obstetrics and Gynecology

## 2020-02-01 NOTE — Telephone Encounter (Signed)
MyChart message to patient.   Per review of Epic: [id number] PFIZER (21 DAY) This visit is at the [address] [place], [place] [address]. Someone will direct you where to park. For scheduling questions, or to cancel please call [phone]. This appointment is for your second dose of the COVID-19 vaccine.   Encounter Closed.

## 2020-02-01 NOTE — Telephone Encounter (Signed)
Patient sent the following correspondence through [place].  Where is patient Engagement center for my second [place] shot? You do not list address?

## 2020-02-05 ENCOUNTER — Ambulatory Visit: Payer: BC Managed Care – PPO | Attending: Internal Medicine

## 2020-02-05 DIAGNOSIS — Z23 Encounter for immunization: Secondary | ICD-10-CM

## 2020-02-05 NOTE — Progress Notes (Signed)
   Covid-19 Vaccination Clinic  Name:  Tricia Barron    MRN: [id number] DOB: 1966-01-12  02/05/2020  [name] was observed post Covid-19 immunization for 15 minutes without incident. She was provided with Vaccine Information Sheet and instruction to access the V-Safe system.   [name] was instructed to call 911 with any severe reactions post vaccine: Marland Kitchen Difficulty breathing  . Swelling of face and throat  . A fast heartbeat  . A bad rash all over body  . Dizziness and weakness   Immunizations Administered    Name Date Dose VIS Date Route   Pfizer COVID-19 Vaccine 02/05/2020  8:25 AM 0.3 mL 11/02/2019 Intramuscular   Manufacturer: [place]   Lot: [id number]   [place]: [id number]

## 2020-05-07 ENCOUNTER — Ambulatory Visit: Payer: 59 | Admitting: Obstetrics and Gynecology

## 2020-06-05 ENCOUNTER — Ambulatory Visit: Payer: 59 | Admitting: Obstetrics and Gynecology

## 2020-09-03 ENCOUNTER — Other Ambulatory Visit: Payer: Self-pay

## 2020-09-03 ENCOUNTER — Other Ambulatory Visit (HOSPITAL_COMMUNITY)
Admission: RE | Admit: 2020-09-03 | Discharge: 2020-09-03 | Disposition: A | Discharge status: Home / Self Care | Payer: 59 | Source: Ambulatory Visit | Attending: Obstetrics and Gynecology | Admitting: Obstetrics and Gynecology

## 2020-09-03 ENCOUNTER — Ambulatory Visit (INDEPENDENT_AMBULATORY_CARE_PROVIDER_SITE_OTHER): Payer: 59 | Admitting: Obstetrics and Gynecology

## 2020-09-03 ENCOUNTER — Encounter: Payer: Self-pay | Admitting: Obstetrics and Gynecology

## 2020-09-03 VITALS — BP 110/74 | HR 79 | Ht 67.0 in | Wt 184.0 lb

## 2020-09-03 DIAGNOSIS — Z01419 Encounter for gynecological examination (general) (routine) without abnormal findings: Secondary | ICD-10-CM | POA: Diagnosis not present

## 2020-09-03 DIAGNOSIS — Z124 Encounter for screening for malignant neoplasm of cervix: Secondary | ICD-10-CM | POA: Insufficient documentation

## 2020-09-03 DIAGNOSIS — Z9189 Other specified personal risk factors, not elsewhere classified: Secondary | ICD-10-CM | POA: Diagnosis not present

## 2020-09-03 NOTE — Patient Instructions (Signed)

## 2020-09-03 NOTE — Progress Notes (Signed)
54 y.o. G61P2002 Married White or Caucasian Not Hispanic or Latino female here for annual exam.  No vaginal bleeding. Mild discomfort with intercourse, lubricant helps.     40 year old mother diagnosed with stage 4 stomach cancer 3 weeks ago. Declining quickly. S/P radiation. Mom was independent and no medical issues prior to this, now needs full time care (local)  Daughter is getting married in 7 days in [place] at the same time as her vow renewal with her husband.    Patient's last menstrual period was 12/24/2007.          Sexually active: Yes.    The current method of family planning is post menopausal status.    Exercising: Yes.    Walking, Bar  Smoker:  no  Health Maintenance: Pap: 02-23-16 WNL NEG HR HPV 12-09-11 WNL NEG HR HPV History of abnormal Pap:  Yes 25 years ago  MMG:  01/16/20 density C BI-rads 3 probably benign, [place] right breast  BMD:  8-19-14Normal  Colonoscopy: 2016 WNL per patient TDaP:  02-28-17 Gardasil: NA   reports that she has never smoked. She has never used smokeless tobacco. She reports that she does not drink alcohol and does not use drugs. Works at [employer] for the benefits. Son is applying to medical school. Daughter living in [place].   Past Medical History:  Diagnosis Date  . Abnormal Pap smear of cervix   . At risk for breast cancer 03/04/2014   High: 20% or greater/Tyrer-Cuzick Model   . Fibroid   . Migraines   . NAFLD (nonalcoholic fatty liver disease)   . Squamous cell skin cancer     Past Surgical History:  Procedure Laterality Date  . AUGMENTATION MAMMAPLASTY Bilateral 1992  . PLANTAR FASCIA SURGERY      Current Outpatient Medications  Medication Sig Dispense Refill  . calcium-vitamin D (OSCAL WITH D) 500-200 MG-UNIT tablet Take 1 tablet by mouth.    . cholecalciferol (VITAMIN D3) 25 MCG (1000 UT) tablet Take 1,000 Units by mouth daily.    . meclizine (ANTIVERT) 25 MG tablet Take 25 mg by mouth 3 (three) times daily as needed.    .  Multiple Vitamins-Minerals (MULTIVITAMIN PO) Take by mouth daily.    . SUMAtriptan (IMITREX) 50 MG tablet Take 1 tablet po prn migraine, may repeat x 1 in 2 hours prn 10 tablet 2   No current facility-administered medications for this visit.    Family History  Problem Relation Age of Onset  . Breast cancer Mother 33       stage 2  . Alcohol abuse Mother   . Mental illness Mother   . Mental illness Maternal Grandmother   . Lupus Maternal Grandfather     Review of Systems  All other systems reviewed and are negative.   Exam:   BP 110/74   Pulse 79   Ht 5\' 7"  (1.702 m)   Wt 184 lb (83.5 kg)   LMP 12/24/2007   SpO2 100%   BMI 28.82 kg/m   Weight change: @WEIGHTCHANGE @ Height:   Height: 5\' 7"  (170.2 cm)  Ht Readings from Last 3 Encounters:  09/03/20 5\' 7"  (1.702 m)  07/05/19 5\' 7"  (1.702 m)  05/03/19 5\' 7"  (1.702 m)    General appearance: alert, cooperative and appears stated age Head: Normocephalic, without obvious abnormality, atraumatic Neck: no adenopathy, supple, symmetrical, trachea midline and thyroid normal to inspection and palpation Lungs: clear to auscultation bilaterally Cardiovascular: regular rate and rhythm Breasts: normal  appearance, no masses or tenderness Abdomen: soft, non-tender; non distended,  no masses,  no organomegaly Extremities: extremities normal, atraumatic, no cyanosis or edema Skin: Skin color, texture, turgor normal. No rashes or lesions Lymph nodes: Cervical, supraclavicular, and axillary nodes normal. No abnormal inguinal nodes palpated Neurologic: Grossly normal   Pelvic: External genitalia:  no lesions              Urethra:  normal appearing urethra with no masses, tenderness or lesions              Bartholins and Skenes: normal                 Vagina: normal appearing vagina with normal color and discharge, no lesions              Cervix: no lesions               Bimanual Exam:  Uterus:  normal size, contour, position,  consistency, mobility, non-tender              Adnexa: no mass, fullness, tenderness               Rectovaginal: Confirms               Anus:  normal sphincter tone, no lesions  Gae Dry chaperoned for the exam.  A:  Well Woman with normal exam  Increased risk of breast cancer  P:   Pap with hpv  Mammogram utd  Breast MRI overdue, will reschedule  Labs with primary  Discussed breast self exam  Discussed calcium and vit D intake

## 2020-09-05 LAB — CYTOLOGY - PAP
Comment: NEGATIVE
Diagnosis: NEGATIVE
High risk HPV: NEGATIVE

## 2020-12-22 ENCOUNTER — Other Ambulatory Visit: Payer: Self-pay | Admitting: Obstetrics and Gynecology

## 2020-12-22 DIAGNOSIS — N63 Unspecified lump in unspecified breast: Secondary | ICD-10-CM

## 2021-01-30 ENCOUNTER — Other Ambulatory Visit: Payer: 59

## 2021-03-09 ENCOUNTER — Ambulatory Visit
Admission: RE | Admit: 2021-03-09 | Discharge: 2021-03-09 | Disposition: A | Discharge status: Home / Self Care | Payer: 59 | Source: Ambulatory Visit | Attending: Obstetrics and Gynecology | Admitting: Obstetrics and Gynecology

## 2021-03-09 ENCOUNTER — Telehealth: Payer: Self-pay

## 2021-03-09 ENCOUNTER — Other Ambulatory Visit: Payer: Self-pay | Admitting: Obstetrics and Gynecology

## 2021-03-09 ENCOUNTER — Other Ambulatory Visit: Payer: Self-pay

## 2021-03-09 DIAGNOSIS — Z9189 Other specified personal risk factors, not elsewhere classified: Secondary | ICD-10-CM

## 2021-03-09 DIAGNOSIS — N63 Unspecified lump in unspecified breast: Secondary | ICD-10-CM

## 2021-03-09 NOTE — Telephone Encounter (Signed)
Patient called to get a referral for a breast MRI.  Dr. Talbert Nan had noted at her 09/03/20 AEX that she was past due.  She had mammogram and breast u/s today:  "RECOMMENDATION: 1.  Screening mammogram in one year.(Code:[id number]-B-01Y)  2. Recommend annual screening breast MRI in addition to routine annual screening mammography, if patient is at elevated lifetime risk of breast cancer (greater than 20%).  I have discussed the findings and recommendations with the patient. If applicable, a reminder letter will be sent to the patient regarding the next appointment."

## 2021-03-09 NOTE — Telephone Encounter (Signed)
Yes, she is overdue for a breast MRI, please schedule. Elevated risk of breast cancer, 25%

## 2021-03-12 NOTE — Telephone Encounter (Signed)
Patient called because she had not heard from [place]. Advised My Chart email sent yesterday. I read it to her but she said this is not where she has MRI done due to claustrophobia. She goes to [facility] in [place]. I printed off previous result and will forward to [facility] for scheduling purposes. I asked patient to keep an eye out on My Chart messages as that may be how we communicate since she has it. She agrees.

## 2021-03-12 NOTE — Telephone Encounter (Signed)
I called [facility] and was told to fax order to [phone] and they will call patient to schedule. Order placed on Dr.Jertson desk to sign once signed I will fax and will relay this to patient.

## 2021-03-12 NOTE — Telephone Encounter (Signed)
Confirmation received via fax. Patient aware she can call or wait for [facility] to call.

## 2021-04-03 ENCOUNTER — Encounter: Payer: Self-pay | Admitting: Obstetrics and Gynecology

## 2021-04-09 ENCOUNTER — Telehealth: Payer: Self-pay | Admitting: Obstetrics and Gynecology

## 2021-04-09 NOTE — Telephone Encounter (Signed)
Normal breast MRI scanned and mychart message sent.

## 2021-06-05 ENCOUNTER — Encounter: Payer: Self-pay | Admitting: Physician Assistant

## 2021-06-05 ENCOUNTER — Telehealth: Payer: 59 | Admitting: Physician Assistant

## 2021-06-05 DIAGNOSIS — M62838 Other muscle spasm: Secondary | ICD-10-CM

## 2021-06-05 MED ORDER — OMEPRAZOLE 20 MG PO CPDR: 20.0000 mg | DELAYED_RELEASE_CAPSULE | Freq: Every day | ORAL | 3 refills | Status: AC

## 2021-06-05 MED ORDER — NAPROXEN 500 MG PO TABS: 500.0000 mg | ORAL_TABLET | Freq: Two times a day (BID) | ORAL | 0 refills | Status: AC

## 2021-06-05 MED ORDER — METHOCARBAMOL 500 MG PO TABS: 500.0000 mg | ORAL_TABLET | Freq: Two times a day (BID) | ORAL | 0 refills | Status: AC | PRN

## 2021-06-05 NOTE — Progress Notes (Signed)
[name], [facility]bauer are scheduled for a virtual visit with your provider today.    Just as we do with appointments in the office, we must obtain your consent to participate.  Your consent will be active for this visit and any virtual visit you may have with one of our providers in the next 365 days.    If you have a MyChart account, I can also send a copy of this consent to you electronically.  All virtual visits are billed to your insurance company just like a traditional visit in the office.  As this is a virtual visit, video technology does not allow for your provider to perform a traditional examination.  This may limit your provider's ability to fully assess your condition.  If your provider identifies any concerns that need to be evaluated in person or the need to arrange testing such as labs, EKG, etc, we will make arrangements to do so.    Although advances in technology are sophisticated, we cannot ensure that it will always work on either your end or our end.  If the connection with a video visit is poor, we may have to switch to a telephone visit.  With either a video or telephone visit, we are not always able to ensure that we have a secure connection.   I need to obtain your verbal consent now.   Are you willing to proceed with your visit today?   Mercedies Ganesh has provided verbal consent on 06/05/2021 for a virtual visit (video or telephone).   Jarrett Soho Riki Berninger, PA-C 06/05/2021  4:55 PM   Date:  06/05/2021   ID:  Tricia Barron, DOB 02/20/1966, MRN [id number]  Patient Location: Home Provider Location: Home Office   Participants: Patient and Provider for Visit and Wrap up  Method of visit: Video  Location of Patient: Home Location of Provider: Home Office Consent was obtain for visit over the video. Services rendered by provider: Visit was performed via video  A video enabled telemedicine application was used and I verified that I am speaking with the correct person using two  identifiers.  PCP:  Burnard Bunting, MD   Chief Complaint:  neck pain  History of Present Illness:    Tricia Barron is a 55 y.o. female with history as stated below. Presents video telehealth for an acute care visit  Onset of symptoms was 1 week and symptoms have been persistent and include: neck pain, bilateral shoulder pain     Modifying factors include: ice, advil and heat without relief.  No new exercises, lifting or pulling, falls or trauma.  Denies known tick bite or rash.  Pain is described as aching in nature.  Patient reports the pain runs along the bilateral paraspinal muscles and trapezius.  It is made worse with movement of her neck and reaching of her arms. Lying still makes the pain much better.  She reports she is able to fully range her neck though it does cause some pain.  Denies fevers, chills, vision changes, headache, neck stiffness, IV drug use, numbness, tingling or weakness.  Patient reports that she can feel a lump towards the base of her neck which is tender to touch but is not red or warm to suggest infection or abscess.  She has no midline tenderness to palpation.  No other aggravating or relieving factors.  No other c/o.  Past Medical, Surgical, Social History, Allergies, and Medications have been Reviewed.  Patient Active Problem List   Diagnosis Date Noted  NAFL (nonalcoholic fatty liver) 55/73/2202    Social History   Tobacco Use   Smoking status: Never   Smokeless tobacco: Never  Substance Use Topics   Alcohol use: Never     Current Outpatient Medications:    methocarbamol (ROBAXIN) 500 MG tablet, Take 1 tablet (500 mg total) by mouth 2 (two) times daily as needed for muscle spasms., Disp: 20 tablet, Rfl: 0   naproxen (NAPROSYN) 500 MG tablet, Take 1 tablet (500 mg total) by mouth 2 (two) times daily with a meal., Disp: 30 tablet, Rfl: 0   omeprazole (PRILOSEC) 20 MG capsule, Take 1 capsule (20 mg total) by mouth daily., Disp: 30 capsule,  Rfl: 3   calcium-vitamin D (OSCAL WITH D) 500-200 MG-UNIT tablet, Take 1 tablet by mouth., Disp: , Rfl:    cholecalciferol (VITAMIN D3) 25 MCG (1000 UT) tablet, Take 1,000 Units by mouth daily., Disp: , Rfl:    meclizine (ANTIVERT) 25 MG tablet, Take 25 mg by mouth 3 (three) times daily as needed., Disp: , Rfl:    Multiple Vitamins-Minerals (MULTIVITAMIN PO), Take by mouth daily., Disp: , Rfl:    SUMAtriptan (IMITREX) 50 MG tablet, Take 1 tablet po prn migraine, may repeat x 1 in 2 hours prn, Disp: 10 tablet, Rfl: 2   Allergies  Allergen Reactions   Phenergan [Promethazine Hcl] Anxiety     Review of Systems  Constitutional:  Negative for chills and fever.  HENT:  Negative for congestion, ear pain and sore throat.   Eyes:  Negative for blurred vision and double vision.  Respiratory:  Negative for cough, shortness of breath and wheezing.   Cardiovascular:  Negative for chest pain, palpitations and leg swelling.  Gastrointestinal:  Negative for abdominal pain, diarrhea, nausea and vomiting.  Genitourinary:  Negative for dysuria.  Musculoskeletal:  Positive for neck pain. Negative for myalgias.  Skin:  Negative for rash.  Neurological:  Negative for loss of consciousness, weakness and headaches.  Psychiatric/Behavioral:  The patient is not nervous/anxious.   See HPI for history of present illness.  Physical Exam Constitutional:      General: She is not in acute distress.    Appearance: Normal appearance. She is not ill-appearing.  HENT:     Head: Normocephalic and atraumatic.     Nose: No congestion.  Eyes:     Extraocular Movements: Extraocular movements intact.  Neck:     Comments: Full range of motion with some paraspinal pain.  Patient denies midline tenderness or sharp pain when palpating down her spine.  She does report tenderness with palpation of her paraspinal muscles and bilateral trapezius muscles. Pulmonary:     Effort: Pulmonary effort is normal.  Musculoskeletal:         General: Normal range of motion.     Cervical back: Normal range of motion.     Comments: Full range of motion of the bilateral shoulders.  Skin:    Coloration: Skin is not pale.  Neurological:     General: No focal deficit present.     Mental Status: She is alert. Mental status is at baseline.  Psychiatric:        Mood and Affect: Mood normal.              A&P  Patient with neck pain.  It appears on the video visit the patient has full range of motion.  No red flags on history or directed physical exam.  1. Muscle spasms of neck -Scheduled naproxen 2 times  per day, Robaxin 2 times per day and omeprazole 1 time per day to prevent stomach upset -Continue using Voltaren gel, ice, heat and gentle stretching  -Monitor for numbness, weakness or worsening pain.  -Have a face-to-face evaluation if you develop fever, neck stiffness, worsening pain, sharp pain, numbness, tingling or weakness.   Patient voiced understanding and agreement to plan.   Time:   Today, I have spent 15 minutes with the patient with telehealth technology discussing the above problems, reviewing the chart, previous notes, medications and orders.    Tests Ordered: No orders of the defined types were placed in this encounter.   Medication Changes: Meds ordered this encounter  Medications   methocarbamol (ROBAXIN) 500 MG tablet    Sig: Take 1 tablet (500 mg total) by mouth 2 (two) times daily as needed for muscle spasms.    Dispense:  20 tablet    Refill:  0   naproxen (NAPROSYN) 500 MG tablet    Sig: Take 1 tablet (500 mg total) by mouth 2 (two) times daily with a meal.    Dispense:  30 tablet    Refill:  0   omeprazole (PRILOSEC) 20 MG capsule    Sig: Take 1 capsule (20 mg total) by mouth daily.    Dispense:  30 capsule    Refill:  3     Disposition:  Follow up urgent care, primary care or emergency department for worsening symptoms  Signed, Abigail Butts, PA-C  06/05/2021 5:23 PM

## 2021-06-05 NOTE — Patient Instructions (Signed)
1. Muscle spasms of neck -Scheduled naproxen 2 times per day, Robaxin 2 times per day and omeprazole 1 time per day to prevent stomach upset -Continue using Voltaren gel, ice, heat and gentle stretching  -Monitor for numbness, weakness or worsening pain.  -Have a face-to-face evaluation if you develop fever, neck stiffness, worsening pain, sharp pain, numbness, tingling or weakness.

## 2021-09-10 ENCOUNTER — Ambulatory Visit: Payer: 59 | Admitting: Obstetrics and Gynecology

## 2021-11-03 NOTE — Telephone Encounter (Signed)
MRI performed 04/03/2021.
